# Patient Record
Sex: Male | Born: 1945 | Race: White | Hispanic: No | Marital: Married | Smoking: Former smoker
Health system: Southern US, Community
[De-identification: ages and names within clinical notes are randomized; demographics above are authoritative.]

## PROBLEM LIST (undated history)

## (undated) DIAGNOSIS — I82409 Acute embolism and thrombosis of unspecified deep veins of unspecified lower extremity: Secondary | ICD-10-CM

## (undated) DIAGNOSIS — C61 Malignant neoplasm of prostate: Secondary | ICD-10-CM

## (undated) DIAGNOSIS — I10 Essential (primary) hypertension: Secondary | ICD-10-CM

## (undated) DIAGNOSIS — K5792 Diverticulitis of intestine, part unspecified, without perforation or abscess without bleeding: Secondary | ICD-10-CM

## (undated) DIAGNOSIS — E611 Iron deficiency: Secondary | ICD-10-CM

## (undated) DIAGNOSIS — F32A Depression, unspecified: Secondary | ICD-10-CM

## (undated) DIAGNOSIS — E7800 Pure hypercholesterolemia, unspecified: Secondary | ICD-10-CM

## (undated) DIAGNOSIS — G4730 Sleep apnea, unspecified: Secondary | ICD-10-CM

## (undated) DIAGNOSIS — M109 Gout, unspecified: Secondary | ICD-10-CM

## (undated) HISTORY — PX: PROSTATE BIOPSY: SHX241

## (undated) HISTORY — DX: Iron deficiency: E61.1

## (undated) HISTORY — PX: BACK SURGERY: SHX140

## (undated) HISTORY — DX: Depression, unspecified: F32.A

---
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
## 2003-11-14 HISTORY — PX: POSTERIOR CERVICAL FUSION/FORAMINOTOMY: SHX5038

## 2009-11-13 DIAGNOSIS — I82409 Acute embolism and thrombosis of unspecified deep veins of unspecified lower extremity: Secondary | ICD-10-CM

## 2009-11-13 HISTORY — DX: Acute embolism and thrombosis of unspecified deep veins of unspecified lower extremity: I82.409

## 2013-07-07 DIAGNOSIS — C61 Malignant neoplasm of prostate: Secondary | ICD-10-CM | POA: Insufficient documentation

## 2014-02-23 ENCOUNTER — Encounter (HOSPITAL_BASED_OUTPATIENT_CLINIC_OR_DEPARTMENT_OTHER): Payer: Self-pay | Admitting: Emergency Medicine

## 2014-02-23 DIAGNOSIS — E785 Hyperlipidemia, unspecified: Secondary | ICD-10-CM | POA: Diagnosis present

## 2014-02-23 DIAGNOSIS — Z8546 Personal history of malignant neoplasm of prostate: Secondary | ICD-10-CM

## 2014-02-23 DIAGNOSIS — E860 Dehydration: Secondary | ICD-10-CM | POA: Diagnosis present

## 2014-02-23 DIAGNOSIS — K5000 Crohn's disease of small intestine without complications: Principal | ICD-10-CM | POA: Diagnosis present

## 2014-02-23 DIAGNOSIS — Z79899 Other long term (current) drug therapy: Secondary | ICD-10-CM

## 2014-02-23 DIAGNOSIS — Z7982 Long term (current) use of aspirin: Secondary | ICD-10-CM

## 2014-02-23 DIAGNOSIS — I10 Essential (primary) hypertension: Secondary | ICD-10-CM | POA: Diagnosis present

## 2014-02-23 LAB — URINALYSIS, ROUTINE W REFLEX MICROSCOPIC
BILIRUBIN URINE: NEGATIVE
Glucose, UA: NEGATIVE mg/dL
Ketones, ur: 15 mg/dL — AB
Leukocytes, UA: NEGATIVE
Nitrite: NEGATIVE
PROTEIN: NEGATIVE mg/dL
SPECIFIC GRAVITY, URINE: 1.025 (ref 1.005–1.030)
UROBILINOGEN UA: 0.2 mg/dL (ref 0.0–1.0)
pH: 5 (ref 5.0–8.0)

## 2014-02-23 LAB — URINE MICROSCOPIC-ADD ON

## 2014-02-23 NOTE — ED Notes (Signed)
Abdominal pain and bloating. No relief with bowel movement. Hx of diverticulitis.

## 2014-02-24 ENCOUNTER — Encounter (HOSPITAL_COMMUNITY): Payer: Self-pay | Admitting: General Practice

## 2014-02-24 ENCOUNTER — Emergency Department (HOSPITAL_BASED_OUTPATIENT_CLINIC_OR_DEPARTMENT_OTHER): Payer: 59

## 2014-02-24 ENCOUNTER — Inpatient Hospital Stay (HOSPITAL_BASED_OUTPATIENT_CLINIC_OR_DEPARTMENT_OTHER)
Admission: EM | Admit: 2014-02-24 | Discharge: 2014-02-27 | DRG: 387 | Disposition: A | Discharge status: Home / Self Care | Payer: 59 | Attending: Internal Medicine | Admitting: Internal Medicine

## 2014-02-24 DIAGNOSIS — E7849 Other hyperlipidemia: Secondary | ICD-10-CM | POA: Diagnosis present

## 2014-02-24 DIAGNOSIS — I10 Essential (primary) hypertension: Secondary | ICD-10-CM

## 2014-02-24 DIAGNOSIS — E860 Dehydration: Secondary | ICD-10-CM

## 2014-02-24 DIAGNOSIS — E785 Hyperlipidemia, unspecified: Secondary | ICD-10-CM

## 2014-02-24 DIAGNOSIS — K5000 Crohn's disease of small intestine without complications: Principal | ICD-10-CM

## 2014-02-24 DIAGNOSIS — K529 Noninfective gastroenteritis and colitis, unspecified: Secondary | ICD-10-CM | POA: Diagnosis present

## 2014-02-24 HISTORY — DX: Diverticulitis of intestine, part unspecified, without perforation or abscess without bleeding: K57.92

## 2014-02-24 HISTORY — DX: Malignant neoplasm of prostate: C61

## 2014-02-24 HISTORY — DX: Essential (primary) hypertension: I10

## 2014-02-24 HISTORY — DX: Sleep apnea, unspecified: G47.30

## 2014-02-24 HISTORY — DX: Acute embolism and thrombosis of unspecified deep veins of unspecified lower extremity: I82.409

## 2014-02-24 HISTORY — DX: Gout, unspecified: M10.9

## 2014-02-24 HISTORY — DX: Pure hypercholesterolemia, unspecified: E78.00

## 2014-02-24 LAB — CBC
HCT: 39.6 % (ref 39.0–52.0)
HEMATOCRIT: 42.8 % (ref 39.0–52.0)
Hemoglobin: 14.9 g/dL (ref 13.0–17.0)
Hemoglobin: 13.8 g/dL (ref 13.0–17.0)
MCH: 33 pg (ref 26.0–34.0)
MCH: 33 pg (ref 26.0–34.0)
MCHC: 34.8 g/dL (ref 30.0–36.0)
MCHC: 34.8 g/dL (ref 30.0–36.0)
MCV: 94.7 fL (ref 78.0–100.0)
MCV: 94.7 fL (ref 78.0–100.0)
PLATELETS: 255 10*3/uL (ref 150–400)
Platelets: 266 10*3/uL (ref 150–400)
RBC: 4.52 MIL/uL (ref 4.22–5.81)
RBC: 4.18 MIL/uL — ABNORMAL LOW (ref 4.22–5.81)
RDW: 12.7 % (ref 11.5–15.5)
RDW: 13.1 % (ref 11.5–15.5)
WBC: 18.2 10*3/uL — AB (ref 4.0–10.5)
WBC: 12.4 10*3/uL — AB (ref 4.0–10.5)

## 2014-02-24 LAB — BASIC METABOLIC PANEL
BUN: 15 mg/dL (ref 6–23)
CHLORIDE: 102 meq/L (ref 96–112)
CO2: 23 mEq/L (ref 19–32)
Calcium: 9.5 mg/dL (ref 8.4–10.5)
Creatinine, Ser: 0.9 mg/dL (ref 0.50–1.35)
GFR, EST NON AFRICAN AMERICAN: 86 mL/min — AB (ref >90)
Glucose, Bld: 107 mg/dL — ABNORMAL HIGH (ref 70–99)
Potassium: 4.1 mEq/L (ref 3.7–5.3)
SODIUM: 140 meq/L (ref 137–147)

## 2014-02-24 LAB — DIFFERENTIAL
BASOS ABS: 0 10*3/uL (ref 0.0–0.1)
Basophils Relative: 0 % (ref 0–1)
Eosinophils Absolute: 0.1 10*3/uL (ref 0.0–0.7)
Eosinophils Relative: 1 % (ref 0–5)
LYMPHS ABS: 1.1 10*3/uL (ref 0.7–4.0)
LYMPHS PCT: 6 % — AB (ref 12–46)
Monocytes Absolute: 1.5 10*3/uL — ABNORMAL HIGH (ref 0.1–1.0)
Monocytes Relative: 9 % (ref 3–12)
NEUTROS ABS: 15.1 10*3/uL — AB (ref 1.7–7.7)
NEUTROS PCT: 85 % — AB (ref 43–77)

## 2014-02-24 LAB — CREATININE, SERUM
Creatinine, Ser: 0.9 mg/dL (ref 0.50–1.35)
GFR, EST NON AFRICAN AMERICAN: 86 mL/min — AB (ref >90)

## 2014-02-24 MED ORDER — ONDANSETRON HCL 4 MG PO TABS: 4.0000 mg | ORAL_TABLET | Freq: Four times a day (QID) | ORAL | Status: DC | PRN

## 2014-02-24 MED ORDER — SENNOSIDES-DOCUSATE SODIUM 8.6-50 MG PO TABS: 1.0000 | ORAL_TABLET | Freq: Every evening | ORAL | Status: DC | PRN

## 2014-02-24 MED ORDER — IOHEXOL 300 MG/ML  SOLN
50.0000 mL | Freq: Once | INTRAMUSCULAR | Status: AC | PRN
  Administered 2014-02-24: 50 mL via ORAL

## 2014-02-24 MED ORDER — MORPHINE SULFATE 2 MG/ML IJ SOLN: 2.0000 mg | INTRAMUSCULAR | Status: DC | PRN

## 2014-02-24 MED ORDER — CIPROFLOXACIN IN D5W 400 MG/200ML IV SOLN
400.0000 mg | Freq: Once | INTRAVENOUS | Status: AC
Start: 2014-02-24 — End: 2014-02-24
  Administered 2014-02-24: 400 mg via INTRAVENOUS
  Filled 2014-02-24 (×2): qty 200

## 2014-02-24 MED ORDER — ALUM & MAG HYDROXIDE-SIMETH 200-200-20 MG/5ML PO SUSP: 30.0000 mL | Freq: Four times a day (QID) | ORAL | Status: DC | PRN

## 2014-02-24 MED ORDER — HYDROCODONE-ACETAMINOPHEN 5-325 MG PO TABS: 1.0000 | ORAL_TABLET | ORAL | Status: DC | PRN

## 2014-02-24 MED ORDER — ONDANSETRON HCL 4 MG/2ML IJ SOLN: 4.0000 mg | Freq: Four times a day (QID) | INTRAMUSCULAR | Status: DC | PRN

## 2014-02-24 MED ORDER — ATORVASTATIN CALCIUM 20 MG PO TABS
20.0000 mg | ORAL_TABLET | Freq: Every day | ORAL | Status: DC
  Administered 2014-02-24 – 2014-02-26 (×3): 20 mg via ORAL
  Filled 2014-02-24 (×4): qty 1

## 2014-02-24 MED ORDER — ACETAMINOPHEN 650 MG RE SUPP: 650.0000 mg | Freq: Four times a day (QID) | RECTAL | Status: DC | PRN

## 2014-02-24 MED ORDER — IOHEXOL 300 MG/ML  SOLN
100.0000 mL | Freq: Once | INTRAMUSCULAR | Status: AC | PRN
  Administered 2014-02-24: 100 mL via INTRAVENOUS

## 2014-02-24 MED ORDER — ACETAMINOPHEN 325 MG PO TABS
650.0000 mg | ORAL_TABLET | Freq: Four times a day (QID) | ORAL | Status: DC | PRN
  Administered 2014-02-24 (×2): 650 mg via ORAL
  Filled 2014-02-24 (×3): qty 2

## 2014-02-24 MED ORDER — SODIUM CHLORIDE 0.9 % IV SOLN
INTRAVENOUS | Status: DC
  Administered 2014-02-24 – 2014-02-25 (×3): via INTRAVENOUS

## 2014-02-24 MED ORDER — ENOXAPARIN SODIUM 40 MG/0.4ML ~~LOC~~ SOLN
40.0000 mg | SUBCUTANEOUS | Status: DC
  Administered 2014-02-24 – 2014-02-27 (×4): 40 mg via SUBCUTANEOUS
  Filled 2014-02-24 (×4): qty 0.4

## 2014-02-24 MED ORDER — METRONIDAZOLE IN NACL 5-0.79 MG/ML-% IV SOLN
500.0000 mg | Freq: Once | INTRAVENOUS | Status: AC
  Administered 2014-02-24: 500 mg via INTRAVENOUS
  Filled 2014-02-24: qty 100

## 2014-02-24 MED ORDER — ACETAMINOPHEN 500 MG PO TABS
1000.0000 mg | ORAL_TABLET | Freq: Once | ORAL | Status: AC
  Administered 2014-02-24: 1000 mg via ORAL
  Filled 2014-02-24: qty 2

## 2014-02-24 MED ORDER — SODIUM CHLORIDE 0.9 % IV SOLN
INTRAVENOUS | Status: DC
  Administered 2014-02-24 – 2014-02-26 (×4): via INTRAVENOUS

## 2014-02-24 NOTE — ED Notes (Signed)
Present to ED for RLQ abdominal pain since 1500 today. Pain is constant, non-radiating, not passing gas.  No N/V/D. Normoactive bowel sounds, tender to palpate on RLQ.

## 2014-02-24 NOTE — ED Provider Notes (Signed)
CSN: [id number]     Arrival date & time 02/23/14  2244 History   First MD Initiated Contact with Patient 02/24/14 0206     Chief Complaint  Patient presents with  . Abdominal Pain     (Consider location/radiation/quality/duration/timing/severity/associated sxs/prior Treatment) HPI This is a 68 year old male with a history of diverticulitis. He is here with a sensation of abdominal bloating that began yesterday afternoon. This has been accompanied by loss of appetite. He subsequently developed right lower quadrant pain. This pain has been waxing and waning. It was a 7/10 at its worst but on 310 at the present time. It is worse with palpation or movement. He has had no associated nausea, vomiting, diarrhea, flatulence, fever or chills.  Past Medical History  Diagnosis Date  . Diverticulitis   . Hypertension   . High cholesterol   . Prostate cancer     newly diagnosed 2 weeks ago   Past Surgical History  Procedure Laterality Date  . Back surgery     No family history on file. History  Substance Use Topics  . Smoking status: Never Smoker   . Smokeless tobacco: Not on file  . Alcohol Use: Yes     Comment: at least 2 glasses of wine every day    Review of Systems  All other systems reviewed and are negative.     Allergies  Review of patient's allergies indicates no known allergies.  Home Medications   Current Outpatient Rx  Name  Route  Sig  Dispense  Refill  . atorvastatin (LIPITOR) 20 MG tablet   Oral   Take 20 mg by mouth daily.         . Olmesartan Medoxomil (BENICAR PO)   Oral   Take by mouth.          BP 130/65  Pulse 78  Temp(Src) 98.7 F (37.1 C) (Oral)  Resp 16  SpO2 95%  Physical Exam General: Well-developed, well-nourished male in no acute distress; appearance consistent with age of record HENT: normocephalic; atraumatic Eyes: pupils equal, round and reactive to light; extraocular muscles intact Neck: supple Heart: regular rate and  rhythm Lungs: clear to auscultation bilaterally Abdomen: soft; nondistended; suprapubic and right lower quadrant tenderness; no masses or hepatosplenomegaly; bowel sounds hypoactive Extremities: No deformity; full range of motion; pulses normal Neurologic: Awake, alert and oriented; motor function intact in all extremities and symmetric; no facial droop Skin: Warm and dry Psychiatric: Normal mood and affect    ED Course  Procedures (including critical care time)  MDM   Nursing notes and vitals signs, including pulse oximetry, reviewed.  Summary of this visit's results, reviewed by myself:  Labs:  Results for orders placed during the hospital encounter of 02/24/14 (from the past 24 hour(s))  URINALYSIS, ROUTINE W REFLEX MICROSCOPIC     Status: Abnormal   Collection Time    02/23/14 11:07 PM      Result Value Ref Range   Color, Urine YELLOW  YELLOW   APPearance CLEAR  CLEAR   Specific Gravity, Urine 1.025  1.005 - 1.030   pH 5.0  5.0 - 8.0   Glucose, UA NEGATIVE  NEGATIVE mg/dL   Hgb urine dipstick MODERATE (*) NEGATIVE   Bilirubin Urine NEGATIVE  NEGATIVE   Ketones, ur 15 (*) NEGATIVE mg/dL   Protein, ur NEGATIVE  NEGATIVE mg/dL   Urobilinogen, UA 0.2  0.0 - 1.0 mg/dL   Nitrite NEGATIVE  NEGATIVE   Leukocytes, UA NEGATIVE  NEGATIVE  URINE  MICROSCOPIC-ADD ON     Status: None   Collection Time    02/23/14 11:07 PM      Result Value Ref Range   Squamous Epithelial / LPF RARE  RARE   WBC, UA 0-2  <3 WBC/hpf   RBC / HPF 3-6  <3 RBC/hpf   Bacteria, UA RARE  RARE   Urine-Other MUCOUS PRESENT    CBC     Status: Abnormal   Collection Time    02/24/14  1:50 AM      Result Value Ref Range   WBC 18.2 (*) 4.0 - 10.5 K/uL   RBC 4.52  4.22 - 5.81 MIL/uL   Hemoglobin 14.9  13.0 - 17.0 g/dL   HCT 42.8  39.0 - 52.0 %   MCV 94.7  78.0 - 100.0 fL   MCH 33.0  26.0 - 34.0 pg   MCHC 34.8  30.0 - 36.0 g/dL   RDW 12.7  11.5 - 15.5 %   Platelets 266  150 - 400 K/uL  BASIC METABOLIC  PANEL     Status: Abnormal   Collection Time    02/24/14  1:50 AM      Result Value Ref Range   Sodium 140  137 - 147 mEq/L   Potassium 4.1  3.7 - 5.3 mEq/L   Chloride 102  96 - 112 mEq/L   CO2 23  19 - 32 mEq/L   Glucose, Bld 107 (*) 70 - 99 mg/dL   BUN 15  6 - 23 mg/dL   Creatinine, Ser 0.90  0.50 - 1.35 mg/dL   Calcium 9.5  8.4 - 10.5 mg/dL   GFR calc non Af Amer 86 (*) >90 mL/min   GFR calc Af Amer >90  >90 mL/min  DIFFERENTIAL     Status: Abnormal   Collection Time    02/24/14  1:50 AM      Result Value Ref Range   Neutrophils Relative % 85 (*) 43 - 77 %   Neutro Abs 15.1 (*) 1.7 - 7.7 K/uL   Lymphocytes Relative 6 (*) 12 - 46 %   Lymphs Abs 1.1  0.7 - 4.0 K/uL   Monocytes Relative 9  3 - 12 %   Monocytes Absolute 1.5 (*) 0.1 - 1.0 K/uL   Eosinophils Relative 1  0 - 5 %   Eosinophils Absolute 0.1  0.0 - 0.7 K/uL   Basophils Relative 0  0 - 1 %   Basophils Absolute 0.0  0.0 - 0.1 K/uL    Imaging Studies: Ct Abdomen Pelvis W Contrast  02/24/2014   CLINICAL DATA:  Right lower quadrant pain for 12 hours  EXAM: CT ABDOMEN AND PELVIS WITH CONTRAST  TECHNIQUE: Multidetector CT imaging of the abdomen and pelvis was performed using the standard protocol following bolus administration of intravenous contrast.  CONTRAST:  87mL OMNIPAQUE IOHEXOL 300 MG/ML SOLN, 142mL OMNIPAQUE IOHEXOL 300 MG/ML SOLN  COMPARISON:  None.  FINDINGS: There is a calcified left lower lobe pulmonary nodule likely representing sequela of prior granulomatous disease.  The liver demonstrates no focal abnormality. There is no intrahepatic or extrahepatic biliary ductal dilatation. The gallbladder is normal. The spleen demonstrates no focal abnormality. The kidneys, adrenal glands and pancreas are normal. The bladder is unremarkable.  The stomach, duodenum, small intestine, and large intestine demonstrate no bowel dilatation to suggest obstruction. There is terminal ileal bowel wall thickening and surrounding  inflammatory changes. There is cecal wall thickening. There are multiple prominent right lower quadrant  lymph nodes present. There is a prominent caliber appendix without periappendiceal inflammatory changes. There is a fat containing umbilical hernia. There is diverticulosis without evidence of diverticulitis. There is no pneumoperitoneum, pneumatosis, or portal venous gas. There is no abdominal or pelvic free fluid. There is no lymphadenopathy.  The abdominal aorta is normal in caliber with atherosclerosis.  There are no lytic or sclerotic osseous lesions.  IMPRESSION: Bowel wall thickening and inflammatory changes involving the terminal ileum and cecum with a few mildly prominent right lower quadrant lymph nodes. This appearance can be seen with an infectious or inflammatory etiology. There is no drainable fluid collection.   Electronically Signed   By: Kathreen Devoid   On: 02/24/2014 03:36   4:47 AM Dr. Shanon Brow accepts for admission to [facility] Will start Cipro and Flagyl for possible infectious etiology.    Wynetta Fines, MD 02/24/14 [phone]

## 2014-02-24 NOTE — ED Notes (Signed)
Patient transported to CT 

## 2014-02-24 NOTE — ED Notes (Signed)
MD at bedside. 

## 2014-02-24 NOTE — H&P (Signed)
[facility] History and Physical  Ragnar Waas LDJ:[id number] DOB: [birth year] DOA: 02/24/2014  Referring physician: Wynetta Fines, MD PCP: Karlene Einstein, MD    Chief Complaint: abdominal pain  HPI: Terry Myers is a 68 y.o. male with PMH as below who presented with abdominal pain which started out in the mid abdomen at 3 PM on 4/13, and then moved to the right lower quadrant. Associated with bloating and lack of appetite. No fevers, vomiting or diarrhea. H/o diverticulitis in the past once. He has had some constipation and has used a stool softener BID for the past week.    General: The patient denies anorexia, fever, weight loss Cardiac: Denies chest pain, syncope, + palpitations - one episode the day after neighbor died, pedal edema  Respiratory: Denies dyspnea on exertion, cough, shortness of breath, wheezing  GI: Denies severe  indigestion/heartburn, abdominal pain, nausea, vomiting, diarrhea  GU: Denies hematuria, incontinence, dysuria - had a foley cah 2 wks ago - became clotted with blood and remove Musculoskeletal: Denies muscle pain Skin: Denies suspicious skin lesions Neurologic: Denies focal weakness or numbness, change in vision  Past Medical History  Diagnosis Date  . Diverticulitis   . Hypertension   . High cholesterol   . Prostate cancer- had and appt at [facility] today     2012   Past Surgical History  Procedure Laterality Date  . Back surgery     Social History:  reports that he has never smoked. He does not have any smokeless tobacco history on file. He reports that he drinks alcohol. He reports that he does not use illicit drugs. Lives at home- works at [employer] with [employer]  No Known Allergies  No family history on file.  Mother had breast CA  Prior to Admission medications   Medication Sig Start Date End Date Taking? Authorizing Provider  atorvastatin (LIPITOR) 20 MG tablet Take 20 mg by mouth daily.   Yes Historical Provider,  MD  Olmesartan Medoxomil (BENICAR PO) 12.5 mg daily Take by mouth.   Yes Historical Provider, MD     Physical Exam: Filed Vitals:   02/24/14 0625  BP: 127/58  Pulse: 69  Temp: 98.5 F (36.9 C)  Resp: 20    General:AAO x 3, no acute distress HEENT: Normocephalic and Atraumatic, Mucous membranes pink- oral mucosa dry                PERRLA; EOM intact; No scleral icterus,                 Nares: Patent, Oropharynx: Clear, Fair Dentition                 Neck: FROM, no cervical lymphadenopathy, thyromegaly, carotid bruit or JVD;  Breasts: deferred CHEST WALL: No tenderness  CHEST: Normal respiration, clear to auscultation bilaterally  HEART: Regular rate and rhythm; no murmurs rubs or gallops  BACK: No kyphosis or scoliosis; no CVA tenderness  ABDOMEN: Positive Bowel Sounds, soft, Quite tender in RLQ and suprapubic areas,  no masses, no organomegaly Rectal Exam: deferred EXTREMITIES: No cyanosis, clubbing, or edema Genitalia: not examined  SKIN:  no rash or ulceration  CNS: Alert and Oriented x 4, Nonfocal exam, CN 2-12 intact  Labs on Admission:  Basic Metabolic Panel:  Recent Labs Lab 02/24/14 0150  NA 140  K 4.1  CL 102  CO2 23  GLUCOSE 107*  BUN 15  CREATININE 0.90  CALCIUM 9.5   Liver Function Tests: No results  found for this basename: AST, ALT, ALKPHOS, BILITOT, PROT, ALBUMIN,  in the last 168 hours No results found for this basename: LIPASE, AMYLASE,  in the last 168 hours No results found for this basename: AMMONIA,  in the last 168 hours CBC:  Recent Labs Lab 02/24/14 0150  WBC 18.2*  NEUTROABS 15.1*  HGB 14.9  HCT 42.8  MCV 94.7  PLT 266   Cardiac Enzymes: No results found for this basename: CKTOTAL, CKMB, CKMBINDEX, TROPONINI,  in the last 168 hours  BNP (last 3 results) No results found for this basename: PROBNP,  in the last 8760 hours CBG: No results found for this basename: GLUCAP,  in the last 168 hours  Radiological Exams on  Admission: Ct Abdomen Pelvis W Contrast  02/24/2014   CLINICAL DATA:  Right lower quadrant pain for 12 hours  EXAM: CT ABDOMEN AND PELVIS WITH CONTRAST  TECHNIQUE: Multidetector CT imaging of the abdomen and pelvis was performed using the standard protocol following bolus administration of intravenous contrast.  CONTRAST:  58mL OMNIPAQUE IOHEXOL 300 MG/ML SOLN, 171mL OMNIPAQUE IOHEXOL 300 MG/ML SOLN  COMPARISON:  None.  FINDINGS: There is a calcified left lower lobe pulmonary nodule likely representing sequela of prior granulomatous disease.  The liver demonstrates no focal abnormality. There is no intrahepatic or extrahepatic biliary ductal dilatation. The gallbladder is normal. The spleen demonstrates no focal abnormality. The kidneys, adrenal glands and pancreas are normal. The bladder is unremarkable.  The stomach, duodenum, small intestine, and large intestine demonstrate no bowel dilatation to suggest obstruction. There is terminal ileal bowel wall thickening and surrounding inflammatory changes. There is cecal wall thickening. There are multiple prominent right lower quadrant lymph nodes present. There is a prominent caliber appendix without periappendiceal inflammatory changes. There is a fat containing umbilical hernia. There is diverticulosis without evidence of diverticulitis. There is no pneumoperitoneum, pneumatosis, or portal venous gas. There is no abdominal or pelvic free fluid. There is no lymphadenopathy.  The abdominal aorta is normal in caliber with atherosclerosis.  There are no lytic or sclerotic osseous lesions.  IMPRESSION: Bowel wall thickening and inflammatory changes involving the terminal ileum and cecum with a few mildly prominent right lower quadrant lymph nodes. This appearance can be seen with an infectious or inflammatory etiology. There is no drainable fluid collection.   Electronically Signed   By: Kathreen Devoid   On: 02/24/2014 03:36    EKG: no  EKG  Assessment/Plan Principal Problem:   Terminal ileitis - Cipro/ Flagyl, Clear liquids- has had a colonoscopy in the past  Active Problems:    Dehydration - IVF hydration    HTN (hypertension) - hold Benicar for today as dehydrated and BP not elevated    Other and unspecified hyperlipidemia - cont Zocor   Consulted: none  Code Status: FULL code  Family Communication: with wife  Disposition Plan: home in 1-2 days   Time spent: >45 in  Debbe Odea, MD [facility]  If 7PM-7AM, please contact night-coverage [email or web address] 02/24/2014, 8:14 AM

## 2014-02-24 NOTE — ED Notes (Signed)
Report given to [place] RN and [facility] at bedside

## 2014-02-25 LAB — CBC
HCT: 39.2 % (ref 39.0–52.0)
Hemoglobin: 13.2 g/dL (ref 13.0–17.0)
MCH: 32.2 pg (ref 26.0–34.0)
MCHC: 33.7 g/dL (ref 30.0–36.0)
MCV: 95.6 fL (ref 78.0–100.0)
PLATELETS: 269 10*3/uL (ref 150–400)
RBC: 4.1 MIL/uL — ABNORMAL LOW (ref 4.22–5.81)
RDW: 13.3 % (ref 11.5–15.5)
WBC: 6.9 10*3/uL (ref 4.0–10.5)

## 2014-02-25 LAB — BASIC METABOLIC PANEL
BUN: 7 mg/dL (ref 6–23)
CALCIUM: 8.9 mg/dL (ref 8.4–10.5)
CO2: 23 meq/L (ref 19–32)
CREATININE: 0.87 mg/dL (ref 0.50–1.35)
Chloride: 104 mEq/L (ref 96–112)
GFR calc non Af Amer: 87 mL/min — ABNORMAL LOW (ref >90)
Glucose, Bld: 89 mg/dL (ref 70–99)
Potassium: 3.9 mEq/L (ref 3.7–5.3)
Sodium: 139 mEq/L (ref 137–147)

## 2014-02-25 MED ORDER — IRBESARTAN 75 MG PO TABS
75.0000 mg | ORAL_TABLET | Freq: Every day | ORAL | Status: DC
  Administered 2014-02-25 – 2014-02-27 (×3): 75 mg via ORAL
  Filled 2014-02-25 (×4): qty 1

## 2014-02-25 MED ORDER — ASPIRIN EC 81 MG PO TBEC
81.0000 mg | DELAYED_RELEASE_TABLET | Freq: Every day | ORAL | Status: DC
  Administered 2014-02-25 – 2014-02-27 (×3): 81 mg via ORAL
  Filled 2014-02-25 (×3): qty 1

## 2014-02-25 NOTE — Progress Notes (Signed)
[facility] PROGRESS NOTE  Terry Myers JGG:[id number] DOB: 1946/05/21 DOA: 02/24/2014 PCP: Karlene Einstein, MD  Assessment/Plan:  Principal Problem:   Terminal ileitis/colitis: presume infectious etiology. Now with profuse watery diarrhea. Continue inpatient status. Continue Cipro and Flagyl empirically. Have ordered GI pathogen panel. White blood cell count improved. Pain somewhat improved. No nausea or vomiting. Decrease IV fluids to 75 cc an hour. Advance to full  liquid diet. Reports having had a colonoscopy about 3 years ago in [place] by a [facility] gastroenterologist. Active Problems:   HTN (hypertension): Resume ARB at lower dose. Hold thiazide   Other and unspecified hyperlipidemia: On statin   Dehydration: Currently euvolemic, but having frequent watery stool.   Code Status:  full Family Communication:   Disposition Plan:  home 1-2 days if continues to improve.  Consultants:  None  Procedures:   None  Antibiotics:  Cipro, Flagyl 4/14  HPI/Subjective: Right lower quadrant Pain remains but improved. Having frequent watery stool. No nausea vomiting. No fevers or chills. Drinks well water. No sick contacts. No recent antibiotics. No suspect foods.  Objective: Filed Vitals:   02/25/14 0532  BP: 125/61  Pulse: 51  Temp: 98.4 F (36.9 C)  Resp: 16    Intake/Output Summary (Last 24 hours) at 02/25/14 1015 Last data filed at 02/25/14 0830  Gross per 24 hour  Intake   3885 ml  Output   4005 ml  Net   -120 ml   Filed Weights   02/25/14 0845  Weight: 99.791 kg (220 lb)    Exam:   General:  Nontoxic appearing.  Cardiovascular: Regular rate rhythm without murmurs gallops rubs  Respiratory: Clear to auscultation bilaterally without wheezes rhonchi or rales  Abdomen: Soft, mild right upper quadrant tenderness. Bowel sounds present.  Ext: No clubbing cyanosis or edema.  Basic Metabolic Panel:  Recent Labs Lab 02/24/14 0150  02/24/14 0950 02/25/14 0527  NA 140  --  139  K 4.1  --  3.9  CL 102  --  104  CO2 23  --  23  GLUCOSE 107*  --  89  BUN 15  --  7  CREATININE 0.90 0.90 0.87  CALCIUM 9.5  --  8.9   Liver Function Tests: No results found for this basename: AST, ALT, ALKPHOS, BILITOT, PROT, ALBUMIN,  in the last 168 hours No results found for this basename: LIPASE, AMYLASE,  in the last 168 hours No results found for this basename: AMMONIA,  in the last 168 hours CBC:  Recent Labs Lab 02/24/14 0150 02/24/14 0950 02/25/14 0527  WBC 18.2* 12.4* 6.9  NEUTROABS 15.1*  --   --   HGB 14.9 13.8 13.2  HCT 42.8 39.6 39.2  MCV 94.7 94.7 95.6  PLT 266 255 269   Cardiac Enzymes: No results found for this basename: CKTOTAL, CKMB, CKMBINDEX, TROPONINI,  in the last 168 hours BNP (last 3 results) No results found for this basename: PROBNP,  in the last 8760 hours CBG: No results found for this basename: GLUCAP,  in the last 168 hours  No results found for this or any previous visit (from the past 240 hour(s)).   Studies: Ct Abdomen Pelvis W Contrast  02/24/2014   CLINICAL DATA:  Right lower quadrant pain for 12 hours  EXAM: CT ABDOMEN AND PELVIS WITH CONTRAST  TECHNIQUE: Multidetector CT imaging of the abdomen and pelvis was performed using the standard protocol following bolus administration of intravenous contrast.  CONTRAST:  68mL OMNIPAQUE IOHEXOL 300  MG/ML SOLN, 157mL OMNIPAQUE IOHEXOL 300 MG/ML SOLN  COMPARISON:  None.  FINDINGS: There is a calcified left lower lobe pulmonary nodule likely representing sequela of prior granulomatous disease.  The liver demonstrates no focal abnormality. There is no intrahepatic or extrahepatic biliary ductal dilatation. The gallbladder is normal. The spleen demonstrates no focal abnormality. The kidneys, adrenal glands and pancreas are normal. The bladder is unremarkable.  The stomach, duodenum, small intestine, and large intestine demonstrate no bowel dilatation to  suggest obstruction. There is terminal ileal bowel wall thickening and surrounding inflammatory changes. There is cecal wall thickening. There are multiple prominent right lower quadrant lymph nodes present. There is a prominent caliber appendix without periappendiceal inflammatory changes. There is a fat containing umbilical hernia. There is diverticulosis without evidence of diverticulitis. There is no pneumoperitoneum, pneumatosis, or portal venous gas. There is no abdominal or pelvic free fluid. There is no lymphadenopathy.  The abdominal aorta is normal in caliber with atherosclerosis.  There are no lytic or sclerotic osseous lesions.  IMPRESSION: Bowel wall thickening and inflammatory changes involving the terminal ileum and cecum with a few mildly prominent right lower quadrant lymph nodes. This appearance can be seen with an infectious or inflammatory etiology. There is no drainable fluid collection.   Electronically Signed   By: Kathreen Devoid   On: 02/24/2014 03:36    Scheduled Meds: . atorvastatin  20 mg Oral Daily  . enoxaparin (LOVENOX) injection  40 mg Subcutaneous Q24H   Continuous Infusions: . sodium chloride 125 mL/hr at 02/24/14 0241  . sodium chloride 125 mL/hr at 02/25/14 0534    Time spent: 35 minutes  [facility] Pager [phone]. If 7PM-7AM, please contact night-coverage at [email or web address], password [facility] 02/25/2014, 10:15 AM  LOS: 1 day

## 2014-02-26 LAB — GI PATHOGEN PANEL BY PCR, STOOL
C difficile toxin A/B: NEGATIVE
Campylobacter by PCR: NEGATIVE
Cryptosporidium by PCR: NEGATIVE
E coli (ETEC) LT/ST: NEGATIVE
E coli (STEC): NEGATIVE
E coli 0157 by PCR: NEGATIVE
G LAMBLIA BY PCR: NEGATIVE
Norovirus GI/GII: NEGATIVE
ROTAVIRUS A BY PCR: NEGATIVE
SHIGELLA BY PCR: NEGATIVE
Salmonella by PCR: NEGATIVE

## 2014-02-26 MED ORDER — METRONIDAZOLE IN NACL 5-0.79 MG/ML-% IV SOLN
500.0000 mg | Freq: Three times a day (TID) | INTRAVENOUS | Status: DC
  Administered 2014-02-26 – 2014-02-27 (×3): 500 mg via INTRAVENOUS
  Filled 2014-02-26 (×4): qty 100

## 2014-02-26 MED ORDER — CIPROFLOXACIN IN D5W 400 MG/200ML IV SOLN
400.0000 mg | Freq: Two times a day (BID) | INTRAVENOUS | Status: DC
  Administered 2014-02-26 – 2014-02-27 (×2): 400 mg via INTRAVENOUS
  Filled 2014-02-26 (×3): qty 200

## 2014-02-26 NOTE — Progress Notes (Signed)
Patient ID: Terry Myers  male  JOA:[id number]    DOB: 05-Feb-1946    DOA: 02/24/2014  PCP: Karlene Einstein, MD  Assessment/Plan: Principal Problem:   Terminal ileitis - Still having watery diarrhea 3 bowel movements today, GI pathogen panel still pending - Received Cipro and Flagyl in ER, Continue ciprofloxacin and Flagyl - Pain has improved, no nausea vomiting, advance diet to solids today  Active Problems:   HTN (hypertension) - Currently stable    Other and unspecified hyperlipidemia - Statins held for now    Dehydration - Continue IV fluid hydration   DVT Prophylaxis:  Code Status: Full code  Family Communication: Discussed with patient's wife in detail  Disposition: Hopefully home tomorrow  Consultants:  None  Procedures:  None  Antibiotics:  IV Cipro and Flagyl    Subjective: Patient reports that history of having diarrhea 3 watery loose BMs since morning, no blood  Objective: Weight change:   Intake/Output Summary (Last 24 hours) at 02/26/14 1543 Last data filed at 02/26/14 0600  Gross per 24 hour  Intake 5467.08 ml  Output   2600 ml  Net 2867.08 ml   Blood pressure 122/58, pulse 48, temperature 97.6 F (36.4 C), temperature source Oral, resp. rate 16, height 6' (1.829 m), weight 99.791 kg (220 lb), SpO2 97.00%.  Physical Exam: General: Alert and awake, oriented x3, not in any acute distress. CVS: S1-S2 clear, no murmur rubs or gallops Chest: clear to auscultation bilaterally, no wheezing, rales or rhonchi Abdomen: soft nontender, mildly distended, normal bowel sounds  Extremities: no cyanosis, clubbing or edema noted bilaterally Neuro: Cranial nerves II-XII intact, no focal neurological deficits  Lab Results: Basic Metabolic Panel:  Recent Labs Lab 02/24/14 0150 02/24/14 0950 02/25/14 0527  NA 140  --  139  K 4.1  --  3.9  CL 102  --  104  CO2 23  --  23  GLUCOSE 107*  --  89  BUN 15  --  7  CREATININE 0.90 0.90 0.87    CALCIUM 9.5  --  8.9   Liver Function Tests: No results found for this basename: AST, ALT, ALKPHOS, BILITOT, PROT, ALBUMIN,  in the last 168 hours No results found for this basename: LIPASE, AMYLASE,  in the last 168 hours No results found for this basename: AMMONIA,  in the last 168 hours CBC:  Recent Labs Lab 02/24/14 0150 02/24/14 0950 02/25/14 0527  WBC 18.2* 12.4* 6.9  NEUTROABS 15.1*  --   --   HGB 14.9 13.8 13.2  HCT 42.8 39.6 39.2  MCV 94.7 94.7 95.6  PLT 266 255 269   Cardiac Enzymes: No results found for this basename: CKTOTAL, CKMB, CKMBINDEX, TROPONINI,  in the last 168 hours BNP: No components found with this basename: POCBNP,  CBG: No results found for this basename: GLUCAP,  in the last 168 hours   Micro Results: No results found for this or any previous visit (from the past 240 hour(s)).  Studies/Results: Ct Abdomen Pelvis W Contrast  02/24/2014   CLINICAL DATA:  Right lower quadrant pain for 12 hours  EXAM: CT ABDOMEN AND PELVIS WITH CONTRAST  TECHNIQUE: Multidetector CT imaging of the abdomen and pelvis was performed using the standard protocol following bolus administration of intravenous contrast.  CONTRAST:  37mL OMNIPAQUE IOHEXOL 300 MG/ML SOLN, 174mL OMNIPAQUE IOHEXOL 300 MG/ML SOLN  COMPARISON:  None.  FINDINGS: There is a calcified left lower lobe pulmonary nodule likely representing sequela of prior granulomatous disease.  The liver demonstrates no focal abnormality. There is no intrahepatic or extrahepatic biliary ductal dilatation. The gallbladder is normal. The spleen demonstrates no focal abnormality. The kidneys, adrenal glands and pancreas are normal. The bladder is unremarkable.  The stomach, duodenum, small intestine, and large intestine demonstrate no bowel dilatation to suggest obstruction. There is terminal ileal bowel wall thickening and surrounding inflammatory changes. There is cecal wall thickening. There are multiple prominent right lower  quadrant lymph nodes present. There is a prominent caliber appendix without periappendiceal inflammatory changes. There is a fat containing umbilical hernia. There is diverticulosis without evidence of diverticulitis. There is no pneumoperitoneum, pneumatosis, or portal venous gas. There is no abdominal or pelvic free fluid. There is no lymphadenopathy.  The abdominal aorta is normal in caliber with atherosclerosis.  There are no lytic or sclerotic osseous lesions.  IMPRESSION: Bowel wall thickening and inflammatory changes involving the terminal ileum and cecum with a few mildly prominent right lower quadrant lymph nodes. This appearance can be seen with an infectious or inflammatory etiology. There is no drainable fluid collection.   Electronically Signed   By: Kathreen Devoid   On: 02/24/2014 03:36    Medications: Scheduled Meds: . aspirin EC  81 mg Oral Daily  . atorvastatin  20 mg Oral Daily  . enoxaparin (LOVENOX) injection  40 mg Subcutaneous Q24H  . irbesartan  75 mg Oral Daily      LOS: 2 days   Ripudeep Krystal Eaton M.D. [facility] 02/26/2014, 3:43 PM Pager: [phone]  If 7PM-7AM, please contact night-coverage [email or web address] Password [email or web address]  **Disclaimer: This note was dictated with voice recognition software. Similar sounding words can inadvertently be transcribed and this note may contain transcription errors which may not have been corrected upon publication of note.**

## 2014-02-27 MED ORDER — LOPERAMIDE HCL 2 MG PO CAPS: 2.0000 mg | ORAL_CAPSULE | ORAL | Status: DC | PRN

## 2014-02-27 MED ORDER — HYDROCODONE-ACETAMINOPHEN 5-325 MG PO TABS: 1.0000 | ORAL_TABLET | Freq: Four times a day (QID) | ORAL | Status: DC | PRN

## 2014-02-27 MED ORDER — METRONIDAZOLE 500 MG PO TABS: 500.0000 mg | ORAL_TABLET | Freq: Three times a day (TID) | ORAL | Status: DC

## 2014-02-27 MED ORDER — CIPROFLOXACIN HCL 500 MG PO TABS: 500.0000 mg | ORAL_TABLET | Freq: Two times a day (BID) | ORAL | Status: DC

## 2014-02-27 MED ORDER — SACCHAROMYCES BOULARDII 250 MG PO CAPS: 250.0000 mg | ORAL_CAPSULE | Freq: Two times a day (BID) | ORAL | Status: DC

## 2014-02-27 MED ORDER — PROMETHAZINE HCL 12.5 MG PO TABS: 12.5000 mg | ORAL_TABLET | Freq: Four times a day (QID) | ORAL | Status: DC | PRN

## 2014-02-27 NOTE — Progress Notes (Signed)
Patient discharged to home with instructions. 

## 2014-02-27 NOTE — Discharge Summary (Signed)
Physician Discharge Summary  Patient ID: Terry Myers MRN: [id number] DOB/AGE: [birth year] 68 y.o.  Admit date: 02/24/2014 Discharge date: 02/27/2014  Primary Care Physician:  Karlene Einstein, MD  Discharge Diagnoses:    . acute Terminal ileitis . abdominal pain right lower quadrant   Profuse diarrhea  . HTN (hypertension) . Other and unspecified hyperlipidemia . Dehydration  Consults: None   Recommendations for Outpatient Follow-up:  Patient should have a GI referral if his symptoms return for a colonoscopy  Allergies:  No Known Allergies   Discharge Medications:   Medication List         aspirin EC 81 MG tablet  Take 81 mg by mouth daily.     ciprofloxacin 500 MG tablet  Commonly known as:  CIPRO  Take 1 tablet (500 mg total) by mouth 2 (two) times daily. X 12 days     HYDROcodone-acetaminophen 5-325 MG per tablet  Commonly known as:  NORCO/VICODIN  Take 1 tablet by mouth every 6 (six) hours as needed for moderate pain.     LIPITOR 20 MG tablet  Generic drug:  atorvastatin  Take 20 mg by mouth daily.     loperamide 2 MG capsule  Commonly known as:  IMODIUM  Take 1 capsule (2 mg total) by mouth as needed for diarrhea or loose stools (also available over the counter).     metroNIDAZOLE 500 MG tablet  Commonly known as:  FLAGYL  Take 1 tablet (500 mg total) by mouth 3 (three) times daily. X 12 days     olmesartan-hydrochlorothiazide 20-12.5 MG per tablet  Commonly known as:  BENICAR HCT  Take 1 tablet by mouth daily.     promethazine 12.5 MG tablet  Commonly known as:  PHENERGAN  Take 1 tablet (12.5 mg total) by mouth every 6 (six) hours as needed for nausea or vomiting.     saccharomyces boulardii 250 MG capsule  Commonly known as:  FLORASTOR  Take 1 capsule (250 mg total) by mouth 2 (two) times daily. X 2 weeks         Brief H and P: For complete details please refer to admission H and P, but in brief Terry Myers is a 68 y.o. male presented  with abdominal pain which started out in the mid abdomen at 3 PM on 4/13, and then moved to the right lower quadrant. Associated with bloating and lack of appetite. No fevers, vomiting or diarrhea. H/o diverticulitis in the past once. He has had some constipation and has used a stool softener BID for the past week.    Hospital Course:   Acute abdominal pain, nausea and vomiting with profuse diarrhea due to Terminal ileitis  Patient was admitted for further workup. CT abdomen and pelvis was done which showed bowel wall thickening and inflammatory changes involving the terminal ileum and cecum with a few mildly prominent right lower quadrant lymph nodes, can be seen with infectious or inflammatory etiology. Noted a fluid collection. GI pathogen panel was negative. Patient was placed on IV ciprofloxacin and Flagyl. Patient had solid bowel movement today and has been tolerating solid diet. Abdominal pain, nausea and vomiting has improved. He has remained afebrile and leukocytosis has resolved. Patient was transitioned to oral antibiotics for 12 days to complete a course for 2 weeks.   HTN (hypertension) - Currently stable   Other and unspecified hyperlipidemia - restart statin  Dehydration -patient was provided IV fluid hydration during hospitalization currently tolerating solid. He was encouraged to continue to  stay hydrated.   Day of Discharge BP 117/61  Pulse 45  Temp(Src) 97.8 F (36.6 C) (Oral)  Resp 16  Ht 6' (1.829 m)  Wt 99.791 kg (220 lb)  BMI 29.83 kg/m2  SpO2 95%  Physical Exam: General: Alert and awake oriented x3 not in any acute distress. HEENT: anicteric sclera, pupils reactive to light and accommodation CVS: S1-S2 clear no murmur rubs or gallops Chest: clear to auscultation bilaterally, no wheezing rales or rhonchi Abdomen: soft nontender, nondistended, normal bowel sounds Extremities: no cyanosis, clubbing or edema noted bilaterally Neuro: Cranial nerves II-XII intact,  no focal neurological deficits   The results of significant diagnostics from this hospitalization (including imaging, microbiology, ancillary and laboratory) are listed below for reference.    LAB RESULTS: Basic Metabolic Panel:  Recent Labs Lab 02/24/14 0150 02/24/14 0950 02/25/14 0527  NA 140  --  139  K 4.1  --  3.9  CL 102  --  104  CO2 23  --  23  GLUCOSE 107*  --  89  BUN 15  --  7  CREATININE 0.90 0.90 0.87  CALCIUM 9.5  --  8.9   Liver Function Tests: No results found for this basename: AST, ALT, ALKPHOS, BILITOT, PROT, ALBUMIN,  in the last 168 hours No results found for this basename: LIPASE, AMYLASE,  in the last 168 hours No results found for this basename: AMMONIA,  in the last 168 hours CBC:  Recent Labs Lab 02/24/14 0150 02/24/14 0950 02/25/14 0527  WBC 18.2* 12.4* 6.9  NEUTROABS 15.1*  --   --   HGB 14.9 13.8 13.2  HCT 42.8 39.6 39.2  MCV 94.7 94.7 95.6  PLT 266 255 269   Cardiac Enzymes: No results found for this basename: CKTOTAL, CKMB, CKMBINDEX, TROPONINI,  in the last 168 hours BNP: No components found with this basename: POCBNP,  CBG: No results found for this basename: GLUCAP,  in the last 168 hours  Significant Diagnostic Studies:  Ct Abdomen Pelvis W Contrast  02/24/2014   CLINICAL DATA:  Right lower quadrant pain for 12 hours  EXAM: CT ABDOMEN AND PELVIS WITH CONTRAST  TECHNIQUE: Multidetector CT imaging of the abdomen and pelvis was performed using the standard protocol following bolus administration of intravenous contrast.  CONTRAST:  56mL OMNIPAQUE IOHEXOL 300 MG/ML SOLN, 164mL OMNIPAQUE IOHEXOL 300 MG/ML SOLN  COMPARISON:  None.  FINDINGS: There is a calcified left lower lobe pulmonary nodule likely representing sequela of prior granulomatous disease.  The liver demonstrates no focal abnormality. There is no intrahepatic or extrahepatic biliary ductal dilatation. The gallbladder is normal. The spleen demonstrates no focal abnormality.  The kidneys, adrenal glands and pancreas are normal. The bladder is unremarkable.  The stomach, duodenum, small intestine, and large intestine demonstrate no bowel dilatation to suggest obstruction. There is terminal ileal bowel wall thickening and surrounding inflammatory changes. There is cecal wall thickening. There are multiple prominent right lower quadrant lymph nodes present. There is a prominent caliber appendix without periappendiceal inflammatory changes. There is a fat containing umbilical hernia. There is diverticulosis without evidence of diverticulitis. There is no pneumoperitoneum, pneumatosis, or portal venous gas. There is no abdominal or pelvic free fluid. There is no lymphadenopathy.  The abdominal aorta is normal in caliber with atherosclerosis.  There are no lytic or sclerotic osseous lesions.  IMPRESSION: Bowel wall thickening and inflammatory changes involving the terminal ileum and cecum with a few mildly prominent right lower quadrant lymph nodes. This appearance can  be seen with an infectious or inflammatory etiology. There is no drainable fluid collection.   Electronically Signed   By: Kathreen Devoid   On: 02/24/2014 03:36       Disposition and Follow-up:     Discharge Orders   Future Orders Complete By Expires   Diet - low sodium heart healthy  As directed    Increase activity slowly  As directed        DISPOSITION: Home DIET: Heart healthy  DISCHARGE FOLLOW-UP Follow-up Information   Follow up with Karlene Einstein, MD. Schedule an appointment as soon as possible for a visit in 2 weeks. (for hospital follow-up)    Specialty:  Family Medicine   Contact information:   [address] [place] [address] [phone]       Time spent on Discharge: 35 minutes  Signed:   Mendel Corning M.D. [facility] 02/27/2014, 2:25 PM Pager: [phone]   **Disclaimer: This note was dictated with voice recognition software. Similar sounding words can  inadvertently be transcribed and this note may contain transcription errors which may not have been corrected upon publication of note.**

## 2015-01-27 DIAGNOSIS — G4733 Obstructive sleep apnea (adult) (pediatric): Secondary | ICD-10-CM | POA: Insufficient documentation

## 2015-01-29 DIAGNOSIS — D6859 Other primary thrombophilia: Secondary | ICD-10-CM | POA: Insufficient documentation

## 2015-04-07 DIAGNOSIS — K37 Unspecified appendicitis: Secondary | ICD-10-CM | POA: Insufficient documentation

## 2016-01-09 DIAGNOSIS — I774 Celiac artery compression syndrome: Secondary | ICD-10-CM | POA: Insufficient documentation

## 2016-01-09 DIAGNOSIS — K5282 Eosinophilic colitis: Secondary | ICD-10-CM | POA: Insufficient documentation

## 2016-01-11 DIAGNOSIS — E538 Deficiency of other specified B group vitamins: Secondary | ICD-10-CM | POA: Insufficient documentation

## 2016-01-11 DIAGNOSIS — D638 Anemia in other chronic diseases classified elsewhere: Secondary | ICD-10-CM | POA: Insufficient documentation

## 2016-01-11 DIAGNOSIS — R7301 Impaired fasting glucose: Secondary | ICD-10-CM | POA: Insufficient documentation

## 2017-01-17 DIAGNOSIS — M720 Palmar fascial fibromatosis [Dupuytren]: Secondary | ICD-10-CM | POA: Insufficient documentation

## 2017-01-17 DIAGNOSIS — L309 Dermatitis, unspecified: Secondary | ICD-10-CM | POA: Insufficient documentation

## 2018-01-21 DIAGNOSIS — Z7901 Long term (current) use of anticoagulants: Secondary | ICD-10-CM | POA: Insufficient documentation

## 2019-04-23 DIAGNOSIS — K140 Glossitis: Secondary | ICD-10-CM | POA: Insufficient documentation

## 2020-04-28 DIAGNOSIS — M6530 Trigger finger, unspecified finger: Secondary | ICD-10-CM | POA: Insufficient documentation

## 2020-11-03 DIAGNOSIS — H2513 Age-related nuclear cataract, bilateral: Secondary | ICD-10-CM | POA: Insufficient documentation

## 2020-11-03 DIAGNOSIS — H43813 Vitreous degeneration, bilateral: Secondary | ICD-10-CM | POA: Insufficient documentation

## 2020-11-03 DIAGNOSIS — H11153 Pinguecula, bilateral: Secondary | ICD-10-CM | POA: Insufficient documentation

## 2020-11-03 DIAGNOSIS — H02831 Dermatochalasis of right upper eyelid: Secondary | ICD-10-CM | POA: Insufficient documentation

## 2020-11-03 DIAGNOSIS — H35033 Hypertensive retinopathy, bilateral: Secondary | ICD-10-CM | POA: Insufficient documentation

## 2020-11-03 DIAGNOSIS — H268 Other specified cataract: Secondary | ICD-10-CM | POA: Insufficient documentation

## 2021-03-03 ENCOUNTER — Telehealth: Payer: Self-pay | Admitting: Family Medicine

## 2021-03-03 NOTE — Telephone Encounter (Signed)
See below

## 2021-03-03 NOTE — Telephone Encounter (Signed)
Patient last provider has retired  From [place] of 40 years. He's looking for a new provider and asked that I ask you. He noted that he saw great review online and would  Like to come on with you. Please advise

## 2021-03-03 NOTE — Telephone Encounter (Signed)
I can not take anyone new at this time but I believe Terry Myers is ----

## 2021-03-03 NOTE — Telephone Encounter (Signed)
Hey Dr. Nani Ravens. This patient is looking for a new provider. I know he is our of your age range for NP but he requested I send a message. I did offer him Josph Macho and Percell Miller, But he prefers an MD. Patient last provider has retired  From [place] of 40 years. He's looking for a new provider and asked that I ask you. He noted that he saw great review online and would  Like to come on with you. Please advise

## 2021-03-04 NOTE — Telephone Encounter (Signed)
OK to sched appt to see if we are a good fit for each other. Ty.

## 2021-03-04 NOTE — Telephone Encounter (Signed)
Thank you :)

## 2021-03-24 NOTE — Telephone Encounter (Signed)
Pt called and appointment rescheduled

## 2021-03-28 ENCOUNTER — Ambulatory Visit: Payer: Medicare Other | Admitting: Family Medicine

## 2021-03-28 ENCOUNTER — Telehealth: Payer: Self-pay | Admitting: *Deleted

## 2021-03-28 NOTE — Telephone Encounter (Signed)
Patient has a new patient appt on 04/12/21.  May need to schedule for VV.

## 2021-03-28 NOTE — Telephone Encounter (Signed)
Call Type Triage / Clinical Relationship To Patient Self Return Phone Number [phone] (Primary) Chief Complaint Headache Reason for Call Symptomatic / Request for Terry Myers states his wife tested positive earlier this week, has headache and start of symptoms Translation No Nurse Assessment Nurse: Ardine Bjork, RN, Melissa Date/Time (Eastern Time): 03/26/2021 2:53:10 PM Confirm and document reason for call. If symptomatic, describe symptoms. ---Caller states his wife tested positive earlier this week. Pt now has headache, fatigue, body aches , wet cough(brown), low-grade fever 100.0 orally, diarrhea and start of symptoms-sxs started yesterday and worse today. Denies vomiting or sob.

## 2021-04-12 ENCOUNTER — Encounter: Payer: Self-pay | Admitting: Family Medicine

## 2021-04-12 ENCOUNTER — Ambulatory Visit (INDEPENDENT_AMBULATORY_CARE_PROVIDER_SITE_OTHER): Payer: Medicare Other | Admitting: Family Medicine

## 2021-04-12 ENCOUNTER — Other Ambulatory Visit: Payer: Self-pay

## 2021-04-12 VITALS — BP 130/72 | HR 57 | Temp 98.3°F | Ht 72.0 in | Wt 200.5 lb

## 2021-04-12 DIAGNOSIS — I825Y1 Chronic embolism and thrombosis of unspecified deep veins of right proximal lower extremity: Secondary | ICD-10-CM | POA: Diagnosis not present

## 2021-04-12 DIAGNOSIS — D6851 Activated protein C resistance: Secondary | ICD-10-CM

## 2021-04-12 DIAGNOSIS — I10 Essential (primary) hypertension: Secondary | ICD-10-CM

## 2021-04-12 DIAGNOSIS — E7849 Other hyperlipidemia: Secondary | ICD-10-CM | POA: Diagnosis not present

## 2021-04-12 NOTE — Progress Notes (Signed)
Chief Complaint  Patient presents with  . New Patient (Initial Visit)       New Patient Visit SUBJECTIVE: HPI: Terry Myers is an 75 y.o.male who is being seen for establishing care.  The patient was previously seen at [facility]. Previous PCP retired.   Hypertension Patient presents for hypertension follow up. He does monitor home blood pressures. Blood pressures ranging on average from 130's/70's. He is compliant with medication- losartan 100 mg/d. Patient has these side effects of medication: none He is adhering to a healthy diet overall. Exercise: active around house and in yard No CP or SOB.  Hyperlipidemia Patient presents for dyslipidemia follow up. Currently being treated with Lipitor 10 mg/d and compliance with treatment thus far has been good. He denies myalgias. His diet is much better since starting the medicine and he is interested in coming off of it. He has never had a heart attack or stroke. The patient is not known to have coexisting coronary artery disease.  He has a history of iron deficiency.  He was worked up by his PCP and hematologist.  No overt reason for this.  He takes both vitamin C and ferrous sulfate daily.  He would like to have his levels rechecked in August.  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  He follows with a vascular surgeon for this.  Patient has a history of prostate cancer.  He received brachytherapy in 2015.  He takes Uroxatrol 10 mg daily by the urology team for which he follows routinely.  Past Medical History:  Diagnosis Date  . Depression   . DVT (deep venous thrombosis) ([place]) 2011   "LLE"  . Gout   . High cholesterol   . Hypertension   . 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 Prostate cancer ([place]) dx'd 06/2011  . Sleep apnea    "wore mask a long time ago"   Past Surgical History:  Procedure Laterality Date  . BACK SURGERY    .  POSTERIOR CERVICAL FUSION/FORAMINOTOMY  2005   C3-C7  . PROSTATE BIOPSY  06/2011; 01/2014  . TONSILLECTOMY  1950's   Family History  Problem Relation Age of Onset  . Cancer Mother   . Heart attack Brother    No Known Allergies  Current Outpatient Medications:  .  alfuzosin (UROXATRAL) 10 MG 24 hr tablet, Take 10 mg by mouth daily with breakfast., Disp: , Rfl:  .  Ascorbic Acid (VITAMIN C) 1000 MG tablet, Take 1,000 mg by mouth daily., Disp: , Rfl:  .  Cholecalciferol (VITAMIN D3) 125 MCG (5000 UT) TABS, Take by mouth., Disp: , Rfl:  .  ferrous sulfate 325 (65 FE) MG tablet, Take 325 mg by mouth daily with breakfast., Disp: , Rfl:  .  losartan (COZAAR) 100 MG tablet, Take 100 mg by mouth daily., Disp: , Rfl:  .  rivaroxaban (XARELTO) 10 MG TABS tablet, Take 10 mg by mouth daily., Disp: , Rfl:  .  vitamin B-12 (CYANOCOBALAMIN) 1000 MCG tablet, Take 1,000 mcg by mouth daily., Disp: , Rfl:   OBJECTIVE: BP 130/72 (BP Location: Left Arm, Patient Position: Sitting, Cuff Size: Normal)   Pulse (!) 57   Temp 98.3 F (36.8 C) (Oral)   Ht 6' (1.829 m)   Wt 200 lb 8 oz (90.9 kg)   SpO2 97%   BMI 27.19 kg/m  General:  well developed, well  nourished, in no apparent distress Skin:  no significant moles, warts, or growths Lungs:  clear to auscultation, breath sounds equal bilaterally, no respiratory distress Cardio:  regular rate and rhythm, no LE edema or bruits Neuro:  gait normal Psych: well oriented with normal range of affect and appropriate judgment/insight  ASSESSMENT/PLAN: Essential hypertension  Other hyperlipidemia  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  Chronic, stable.  Continue losartan 100 mg daily.  Counseled on diet and exercise.  Does not need to monitor blood pressure at home. 2.  We will stop Lipitor 10 mg daily for now.  We will recheck labs in 3 months for his annual visit.  This is  primary prevention.  We will calculate his 10-year CVD risk at that time. 3/4.  He follows with other vascular surgery for these things.  Continue Xarelto 10 mg daily. Patient should return in 3 months for a physical. The patient voiced understanding and agreement to the plan.   [place], DO 04/12/21  4:57 PM

## 2021-04-12 NOTE — Patient Instructions (Addendum)
Nice meeting you today.   OK to stop the Lipitor for now. We will check your labs in August.   Keep the diet clean and stay active.  Let us know if you need anything.

## 2021-04-14 NOTE — Telephone Encounter (Signed)
Already updated immunizations , please send back exercise for patient , was unaware on location of pain nor did I see it mentioned in AVS  so I didn't use the smart phrase in epic

## 2021-05-24 ENCOUNTER — Ambulatory Visit (INDEPENDENT_AMBULATORY_CARE_PROVIDER_SITE_OTHER): Payer: Medicare Other | Admitting: Family Medicine

## 2021-05-24 ENCOUNTER — Encounter: Payer: Self-pay | Admitting: Family Medicine

## 2021-05-24 ENCOUNTER — Other Ambulatory Visit: Payer: Self-pay

## 2021-05-24 VITALS — BP 132/72 | HR 93 | Temp 98.2°F | Ht 72.0 in | Wt 203.2 lb

## 2021-05-24 DIAGNOSIS — G8929 Other chronic pain: Secondary | ICD-10-CM | POA: Diagnosis not present

## 2021-05-24 DIAGNOSIS — R29898 Other symptoms and signs involving the musculoskeletal system: Secondary | ICD-10-CM

## 2021-05-24 DIAGNOSIS — M545 Low back pain, unspecified: Secondary | ICD-10-CM

## 2021-05-24 MED ORDER — TRAMADOL HCL 50 MG PO TABS: 50.0000 mg | ORAL_TABLET | Freq: Three times a day (TID) | ORAL | 0 refills | Status: AC | PRN

## 2021-05-24 MED ORDER — DICLOFENAC SODIUM 1 % EX GEL
2.0000 g | Freq: Four times a day (QID) | CUTANEOUS | 1 refills | Status: DC
Start: 2021-05-24 — End: 2021-08-01

## 2021-05-24 NOTE — Patient Instructions (Addendum)
Heat (pad or rice pillow in microwave) over affected area, 10-15 minutes twice daily.   Ice/cold pack over area for 10-15 min twice daily.  OK to take Tylenol 1000 mg (2 extra strength tabs) or 975 mg (3 regular strength tabs) every 6 hours as needed.  Give me a week to set up the MRI. Send me a message if you don't hear anything.  Consider Voltaren gel 4 times daily as needed. This is available over the counter but compare prices with what I send in.   Do not drink alcohol, do any illicit/street drugs, drive or do anything that requires alertness while on this medicine.   Let us know if you need anything.

## 2021-05-24 NOTE — Progress Notes (Signed)
Chief Complaint  Patient presents with   Leg Pain    Left leg pain     Subjective: Patient is a 75 y.o. male here for f/u back pain.  At the end of May, the patient was given stretches and exercises for chronic low back pain.  While his flexibility has improved, his back pain remains unchanged.  He is now starting to have left lower extremity weakness, tingling, and pain.  No recent injury or change in activity that exacerbated things.  Denies any bruising, redness, swelling, or bowel/bladder incontinence.  Tylenol provides minimal relief.  He takes Xarelto daily for chronic DVT so is not taking any oral anti-inflammatories.  Past Medical History:  Diagnosis Date   Depression    DVT (deep venous thrombosis) ([place]) 2011   "LLE"   Gout    High cholesterol    Hypertension    Iron deficiency    Idiopathic, evaluated by hematology   Prostate cancer ([place]) dx'd 06/2011   Sleep apnea    "wore mask a long time ago"    Objective: BP 132/72   Pulse 93   Temp 98.2 F (36.8 C) (Oral)   Ht 6' (1.829 m)   Wt 203 lb 4 oz (92.2 kg)   SpO2 96%   BMI 27.57 kg/m  General: Awake, appears stated age MSK: No ttp or deformity, no atrophy Neuro: Nml gait, DTR's equal and symmetric w/o clonus. 4/5 strength with hip flexion, knee flexion/ext on L; 5/5 strength on R.  Lungs: No accessory muscle use Psych: Age appropriate judgment and insight, normal affect and mood  Assessment and Plan: Chronic left-sided low back pain without sciatica - Plan: MR Lumbar Spine Wo Contrast, diclofenac Sodium (VOLTAREN) 1 % GEL, traMADol (ULTRAM) 50 MG tablet  Left leg weakness - Plan: MR Lumbar Spine Wo Contrast  Chronic, worsening. Ck MRI to rule out nerve impingement. Trial topical NSAID as he is on Xarelto. Cont Tylenol. Tramadol prn. Warnings about this verbalized and written down. Ice, heat. F/u as originally scheduled.  The patient voiced understanding and agreement to the plan.  [place],  DO 05/24/21  3:51 PM

## 2021-05-28 ENCOUNTER — Ambulatory Visit (HOSPITAL_BASED_OUTPATIENT_CLINIC_OR_DEPARTMENT_OTHER)
Admission: RE | Admit: 2021-05-28 | Discharge: 2021-05-28 | Disposition: A | Discharge status: Home / Self Care | Payer: Medicare Other | Source: Ambulatory Visit | Attending: Family Medicine | Admitting: Family Medicine

## 2021-05-28 ENCOUNTER — Other Ambulatory Visit: Payer: Self-pay

## 2021-05-28 DIAGNOSIS — M545 Low back pain, unspecified: Secondary | ICD-10-CM | POA: Insufficient documentation

## 2021-05-28 DIAGNOSIS — R29898 Other symptoms and signs involving the musculoskeletal system: Secondary | ICD-10-CM

## 2021-05-28 DIAGNOSIS — G8929 Other chronic pain: Secondary | ICD-10-CM | POA: Diagnosis present

## 2021-05-30 ENCOUNTER — Other Ambulatory Visit: Payer: Self-pay | Admitting: Family Medicine

## 2021-05-30 DIAGNOSIS — G8929 Other chronic pain: Secondary | ICD-10-CM

## 2021-05-30 DIAGNOSIS — R29898 Other symptoms and signs involving the musculoskeletal system: Secondary | ICD-10-CM

## 2021-05-30 DIAGNOSIS — M545 Low back pain, unspecified: Secondary | ICD-10-CM

## 2021-06-07 ENCOUNTER — Encounter: Payer: Self-pay | Admitting: Physical Medicine & Rehabilitation

## 2021-07-13 ENCOUNTER — Ambulatory Visit (INDEPENDENT_AMBULATORY_CARE_PROVIDER_SITE_OTHER): Payer: Medicare Other | Admitting: Family Medicine

## 2021-07-13 ENCOUNTER — Encounter: Payer: Self-pay | Admitting: Family Medicine

## 2021-07-13 ENCOUNTER — Other Ambulatory Visit: Payer: Self-pay

## 2021-07-13 VITALS — BP 118/66 | HR 56 | Temp 98.9°F | Ht 72.0 in | Wt 203.4 lb

## 2021-07-13 DIAGNOSIS — D509 Iron deficiency anemia, unspecified: Secondary | ICD-10-CM

## 2021-07-13 DIAGNOSIS — I10 Essential (primary) hypertension: Secondary | ICD-10-CM | POA: Diagnosis not present

## 2021-07-13 DIAGNOSIS — E559 Vitamin D deficiency, unspecified: Secondary | ICD-10-CM | POA: Diagnosis not present

## 2021-07-13 DIAGNOSIS — E7849 Other hyperlipidemia: Secondary | ICD-10-CM | POA: Diagnosis not present

## 2021-07-13 DIAGNOSIS — Z0000 Encounter for general adult medical examination without abnormal findings: Secondary | ICD-10-CM | POA: Diagnosis not present

## 2021-07-13 DIAGNOSIS — Z1159 Encounter for screening for other viral diseases: Secondary | ICD-10-CM

## 2021-07-13 NOTE — Progress Notes (Signed)
Chief Complaint  Patient presents with   Annual Exam    Well Male Terry Myers is here for a complete physical.   His last physical was >1 year ago.  Current diet: in general, a "healthy" diet.   Current exercise: walking, active around house Weight trend: stable Fatigue out of ordinary? No. Seat belt? Yes.    Health maintenance Shingrix- No Colonoscopy- Yes Tetanus- Yes Hep C- Yes Pneumonia vaccine- Yes  Past Medical History:  Diagnosis Date   Depression    DVT (deep venous thrombosis) ([place]) 2011   "LLE"   Gout    High cholesterol    Hypertension    Iron deficiency    Idiopathic, evaluated by hematology   Prostate cancer ([place]) dx'd 06/2011   Sleep apnea    "wore mask a long time ago"     Past Surgical History:  Procedure Laterality Date   BACK SURGERY     POSTERIOR CERVICAL FUSION/FORAMINOTOMY  2005   C3-C7   PROSTATE BIOPSY  06/2011; 01/2014   TONSILLECTOMY  1950's    Medications  Current Outpatient Medications on File Prior to Visit  Medication Sig Dispense Refill   alfuzosin (UROXATRAL) 10 MG 24 hr tablet Take 10 mg by mouth daily with breakfast.     Ascorbic Acid (VITAMIN C) 1000 MG tablet Take 1,000 mg by mouth daily.     diclofenac Sodium (VOLTAREN) 1 % GEL Apply 2 g topically 4 (four) times daily. 100 g 1   losartan (COZAAR) 100 MG tablet Take 100 mg by mouth daily.     rivaroxaban (XARELTO) 10 MG TABS tablet Take 10 mg by mouth daily.     vitamin B-12 (CYANOCOBALAMIN) 1000 MCG tablet Take 1,000 mcg by mouth daily.      Allergies No Known Allergies  Family History Family History  Problem Relation Age of Onset   Cancer Mother    Heart attack Brother     Review of Systems: Constitutional:  no fevers Eye:  no recent significant change in vision Ears:  No changes in hearing Nose/Mouth/Throat:  no complaints of nasal congestion, no sore throat Cardiovascular: no chest pain Respiratory:  No shortness of breath Gastrointestinal:  No change in  bowel habits GU:  No frequency Integumentary:  no abnormal skin lesions reported Neurologic:  no headaches Endocrine:  denies unexplained weight changes  Exam BP 118/66   Pulse (!) 56   Temp 98.9 F (37.2 C) (Oral)   Ht 6' (1.829 m)   Wt 203 lb 6 oz (92.3 kg)   SpO2 96%   BMI 27.58 kg/m  General:  well developed, well nourished, in no apparent distress Skin:  no significant moles, warts, or growths Head:  no masses, lesions, or tenderness Eyes:  pupils equal and round, sclera anicteric without injection Ears:  canals without lesions, TMs shiny without retraction, no obvious effusion, no erythema Nose:  nares patent, septum midline, mucosa normal Throat/Pharynx:  lips and gingiva without lesion; tongue and uvula midline; non-inflamed pharynx; no exudates or postnasal drainage Lungs:  clear to auscultation, breath sounds equal bilaterally, no respiratory distress Cardio:  bradycardic, regular rhythm, no LE edema or bruits Rectal: Deferred GI: BS+, S, NT, ND, no masses or organomegaly Musculoskeletal:  symmetrical muscle groups noted without atrophy or deformity Neuro:  gait normal; deep tendon reflexes normal and symmetric Psych: well oriented with normal range of affect and appropriate judgment/insight  Assessment and Plan  Well adult exam  Iron deficiency anemia, unspecified iron deficiency anemia  type - Plan: CBC w/Diff, IBC + Ferritin  Essential hypertension  Other hyperlipidemia - Plan: Lipid panel, Comprehensive metabolic panel  Encounter for hepatitis C screening test for low risk patient - Plan: Hepatitis C antibody  Vitamin D insufficiency - Plan: VITAMIN D 25 Hydroxy (Vit-D Deficiency, Fractures)   Well 75 y.o. male. Counseled on diet and exercise. Other orders as above. Shingrix rec'd.  Flu shot rec'd in mid Oct. Follow up in 6 mo or prn.  The patient voiced understanding and agreement to the plan.  [place], DO 07/13/21 3:06 PM

## 2021-07-13 NOTE — Patient Instructions (Addendum)
Give [place] 2-3 business days to get the results of your labs back.   Keep the diet clean and stay active.  I recommend getting the flu shot in mid October. This suggestion would change if the CDC comes out with a different recommendation.   The new Shingrix vaccine (for shingles) is a 2 shot series. It can make people feel low energy, achy and almost like they have the flu for 48 hours after injection. Please plan accordingly when deciding on when to get this shot. Call your pharmacy for an appointment to get this. The second shot of the series is less severe regarding the side effects, but it still lasts 48 hours.   Let us know if you need anything.

## 2021-07-14 LAB — COMPREHENSIVE METABOLIC PANEL
ALT: 12 U/L (ref 0–53)
AST: 14 U/L (ref 0–37)
Albumin: 4.2 g/dL (ref 3.5–5.2)
Alkaline Phosphatase: 42 U/L (ref 39–117)
BUN: 13 mg/dL (ref 6–23)
CO2: 29 mEq/L (ref 19–32)
Calcium: 9.6 mg/dL (ref 8.4–10.5)
Chloride: 103 mEq/L (ref 96–112)
Creatinine, Ser: 1 mg/dL (ref 0.40–1.50)
GFR: 73.79 mL/min (ref >60.00)
Glucose, Bld: 74 mg/dL (ref 70–99)
Potassium: 4.6 mEq/L (ref 3.5–5.1)
Sodium: 139 mEq/L (ref 135–145)
Total Bilirubin: 1 mg/dL (ref 0.2–1.2)
Total Protein: 6.8 g/dL (ref 6.0–8.3)

## 2021-07-14 LAB — IBC + FERRITIN
Ferritin: 65.2 ng/mL (ref 22.0–322.0)
Iron: 141 ug/dL (ref 42–165)
Saturation Ratios: 40.4 % (ref 20.0–50.0)
TIBC: 348.6 ug/dL (ref 250.0–450.0)
Transferrin: 249 mg/dL (ref 212.0–360.0)

## 2021-07-14 LAB — CBC WITH DIFFERENTIAL/PLATELET
Basophils Absolute: 0 10*3/uL (ref 0.0–0.1)
Basophils Relative: 0.3 % (ref 0.0–3.0)
Eosinophils Absolute: 0.3 10*3/uL (ref 0.0–0.7)
Eosinophils Relative: 4.7 % (ref 0.0–5.0)
HCT: 42.3 % (ref 39.0–52.0)
Hemoglobin: 13.9 g/dL (ref 13.0–17.0)
Lymphocytes Relative: 22.1 % (ref 12.0–46.0)
Lymphs Abs: 1.2 10*3/uL (ref 0.7–4.0)
MCHC: 32.9 g/dL (ref 30.0–36.0)
MCV: 98.2 fl (ref 78.0–100.0)
Monocytes Absolute: 0.5 10*3/uL (ref 0.1–1.0)
Monocytes Relative: 9.5 % (ref 3.0–12.0)
Neutro Abs: 3.5 10*3/uL (ref 1.4–7.7)
Neutrophils Relative %: 63.4 % (ref 43.0–77.0)
Platelets: 190 10*3/uL (ref 150.0–400.0)
RBC: 4.31 Mil/uL (ref 4.22–5.81)
RDW: 14.4 % (ref 11.5–15.5)
WBC: 5.5 10*3/uL (ref 4.0–10.5)

## 2021-07-14 LAB — HEPATITIS C ANTIBODY
Hepatitis C Ab: NONREACTIVE
SIGNAL TO CUT-OFF: 0.01 (ref <1.00)

## 2021-07-14 LAB — VITAMIN D 25 HYDROXY (VIT D DEFICIENCY, FRACTURES): VITD: 27.75 ng/mL — ABNORMAL LOW (ref 30.00–100.00)

## 2021-07-14 LAB — LIPID PANEL
Cholesterol: 176 mg/dL (ref 0–200)
HDL: 39.7 mg/dL (ref >39.00)
LDL Cholesterol: 107 mg/dL — ABNORMAL HIGH (ref 0–99)
NonHDL: 136.29
Total CHOL/HDL Ratio: 4
Triglycerides: 145 mg/dL (ref 0.0–149.0)
VLDL: 29 mg/dL (ref 0.0–40.0)

## 2021-08-01 ENCOUNTER — Other Ambulatory Visit: Payer: Self-pay

## 2021-08-01 ENCOUNTER — Ambulatory Visit (INDEPENDENT_AMBULATORY_CARE_PROVIDER_SITE_OTHER): Payer: Medicare Other | Admitting: Family Medicine

## 2021-08-01 ENCOUNTER — Encounter: Payer: Self-pay | Admitting: Family Medicine

## 2021-08-01 VITALS — BP 120/72 | HR 79 | Temp 98.4°F | Ht 72.0 in | Wt 204.5 lb

## 2021-08-01 DIAGNOSIS — I6521 Occlusion and stenosis of right carotid artery: Secondary | ICD-10-CM | POA: Diagnosis not present

## 2021-08-01 MED ORDER — ROSUVASTATIN CALCIUM 10 MG PO TABS: 10.0000 mg | ORAL_TABLET | Freq: Every day | ORAL | 3 refills | Status: AC

## 2021-08-01 NOTE — Patient Instructions (Addendum)
Stay hydrated.   Keep the diet clean and stay active.  Let us know if you need anything.

## 2021-08-01 NOTE — Progress Notes (Signed)
Chief Complaint  Patient presents with   Results    Discuss     Subjective: Patient is a 75 y.o. male here for f/u imaging results.  Pt is in an Alzheimer's study thru [facility]. He gets yearly carotid scans. I had taken him off of his statin as his cholesterol looked good off of it. Recently we received his yearly results showing stable 2 mm plaque in the R carotid. No hx of bruits and no vision changes. He takes Xarelto for recurrent DVT.   Past Medical History:  Diagnosis Date   Depression    DVT (deep venous thrombosis) ([place]) 2011   "LLE"   Gout    High cholesterol    Hypertension    Iron deficiency    Idiopathic, evaluated by hematology   Prostate cancer ([place]) dx'd 06/2011   Sleep apnea    "wore mask a long time ago"    Objective: BP 120/72   Pulse 79   Temp 98.4 F (36.9 C) (Oral)   Ht 6' (1.829 m)   Wt 204 lb 8 oz (92.8 kg)   SpO2 96%   BMI 27.74 kg/m  General: Awake, appears stated age Heart: RRR, no murmurs, I did not hear any bruits Lungs: CTAB, no rales, wheezes or rhonchi. No accessory muscle use Psych: Age appropriate judgment and insight, normal affect and mood  Assessment and Plan: Atherosclerosis of right carotid artery - Plan: rosuvastatin (CRESTOR) 10 MG tablet  Chronic, uncontrolled. Go back on Crestor. Ck labs in 6 weeks. Counseled on diet/exercise. Discussed ASA, decided not worth risk given chronic Xarelto use. F/u as originally scheduled.  The patient voiced understanding and agreement to the plan.  [place], DO 08/01/21  1:39 PM

## 2021-08-09 ENCOUNTER — Ambulatory Visit: Payer: Medicare Other | Admitting: Physical Medicine & Rehabilitation

## 2021-08-31 ENCOUNTER — Other Ambulatory Visit: Payer: Self-pay | Admitting: Family Medicine

## 2021-08-31 DIAGNOSIS — H9190 Unspecified hearing loss, unspecified ear: Secondary | ICD-10-CM

## 2021-09-13 ENCOUNTER — Other Ambulatory Visit: Payer: Self-pay

## 2021-09-13 ENCOUNTER — Other Ambulatory Visit (INDEPENDENT_AMBULATORY_CARE_PROVIDER_SITE_OTHER): Payer: Medicare Other

## 2021-09-13 DIAGNOSIS — I6521 Occlusion and stenosis of right carotid artery: Secondary | ICD-10-CM

## 2021-09-13 LAB — LIPID PANEL
Cholesterol: 122 mg/dL (ref 0–200)
HDL: 40.3 mg/dL (ref >39.00)
LDL Cholesterol: 62 mg/dL (ref 0–99)
NonHDL: 81.29
Total CHOL/HDL Ratio: 3
Triglycerides: 98 mg/dL (ref 0.0–149.0)
VLDL: 19.6 mg/dL (ref 0.0–40.0)

## 2021-09-13 LAB — HEPATIC FUNCTION PANEL
ALT: 16 U/L (ref 0–53)
AST: 17 U/L (ref 0–37)
Albumin: 4.2 g/dL (ref 3.5–5.2)
Alkaline Phosphatase: 45 U/L (ref 39–117)
Bilirubin, Direct: 0.2 mg/dL (ref 0.0–0.3)
Total Bilirubin: 0.9 mg/dL (ref 0.2–1.2)
Total Protein: 6.4 g/dL (ref 6.0–8.3)

## 2021-09-21 ENCOUNTER — Telehealth: Payer: Self-pay | Admitting: Family Medicine

## 2021-09-21 NOTE — Telephone Encounter (Signed)
Left message for patient to call back and schedule Medicare Annual Wellness Visit (AWV) in office.   If not able to come in office, please offer to do virtually or by telephone.  Left office number and my jabber [phone].  Last AWV: 03/14/2019  Please schedule at anytime with Nurse Health Advisor.

## 2021-11-01 ENCOUNTER — Encounter: Payer: Self-pay | Admitting: Family Medicine

## 2021-11-02 MED ORDER — LOSARTAN POTASSIUM 100 MG PO TABS: 100.0000 mg | ORAL_TABLET | Freq: Every day | ORAL | 1 refills | Status: AC

## 2021-11-02 MED ORDER — ALFUZOSIN HCL ER 10 MG PO TB24: 10.0000 mg | ORAL_TABLET | Freq: Every day | ORAL | 1 refills | Status: AC

## 2021-11-04 ENCOUNTER — Encounter: Payer: Self-pay | Admitting: Family Medicine

## 2021-11-16 DIAGNOSIS — M5417 Radiculopathy, lumbosacral region: Secondary | ICD-10-CM | POA: Diagnosis not present

## 2021-11-21 DIAGNOSIS — K200 Eosinophilic esophagitis: Secondary | ICD-10-CM | POA: Insufficient documentation

## 2021-11-21 DIAGNOSIS — K5282 Eosinophilic colitis: Secondary | ICD-10-CM | POA: Diagnosis not present

## 2021-12-12 DIAGNOSIS — G4733 Obstructive sleep apnea (adult) (pediatric): Secondary | ICD-10-CM | POA: Diagnosis not present

## 2021-12-27 ENCOUNTER — Ambulatory Visit (INDEPENDENT_AMBULATORY_CARE_PROVIDER_SITE_OTHER): Payer: Medicare Other

## 2021-12-27 VITALS — Ht 72.0 in | Wt 204.0 lb

## 2021-12-27 DIAGNOSIS — Z0000 Encounter for general adult medical examination without abnormal findings: Secondary | ICD-10-CM

## 2021-12-27 NOTE — Patient Instructions (Signed)
Terry Myers , Thank you for taking time to complete your Medicare Wellness Visit. I appreciate your ongoing commitment to your health goals. Please review the following plan we discussed and let me know if I can assist you in the future.   Screening recommendations/referrals: Colonoscopy: Completed 07/13/2017-Due 07/14/2027 Recommended yearly ophthalmology/optometry visit for glaucoma screening and checkup Recommended yearly dental visit for hygiene and checkup  Vaccinations: Influenza vaccine: Up to date Pneumococcal vaccine: Up to date Tdap vaccine: Up to date Shingles vaccine: Declined   Covid-19: Up to date  Advanced directives: Information mailed today  Conditions/risks identified: See problem list  Next appointment: Follow up in one year for your annual wellness visit.   Preventive Care 39 Years and Older, Male Preventive care refers to lifestyle choices and visits with your health care provider that can promote health and wellness. What does preventive care include? A yearly physical exam. This is also called an annual well check. Dental exams once or twice a year. Routine eye exams. Ask your health care provider how often you should have your eyes checked. Personal lifestyle choices, including: Daily care of your teeth and gums. Regular physical activity. Eating a healthy diet. Avoiding tobacco and drug use. Limiting alcohol use. Practicing safe sex. Taking low doses of aspirin every day. Taking vitamin and mineral supplements as recommended by your health care provider. What happens during an annual well check? The services and screenings done by your health care provider during your annual well check will depend on your age, overall health, lifestyle risk factors, and family history of disease. Counseling  Your health care provider may ask you questions about your: Alcohol use. Tobacco use. Drug use. Emotional well-being. Home and relationship well-being. Sexual  activity. Eating habits. History of falls. Memory and ability to understand (cognition). Work and work Statistician. Screening  You may have the following tests or measurements: Height, weight, and BMI. Blood pressure. Lipid and cholesterol levels. These may be checked every 5 years, or more frequently if you are over 46 years old. Skin check. Lung cancer screening. You may have this screening every year starting at age 22 if you have a 30-pack-year history of smoking and currently smoke or have quit within the past 15 years. Fecal occult blood test (FOBT) of the stool. You may have this test every year starting at age 92. Flexible sigmoidoscopy or colonoscopy. You may have a sigmoidoscopy every 5 years or a colonoscopy every 10 years starting at age 17. Prostate cancer screening. Recommendations will vary depending on your family history and other risks. Hepatitis C blood test. Hepatitis B blood test. Sexually transmitted disease (STD) testing. Diabetes screening. This is done by checking your blood sugar (glucose) after you have not eaten for a while (fasting). You may have this done every 1-3 years. Abdominal aortic aneurysm (AAA) screening. You may need this if you are a current or former smoker. Osteoporosis. You may be screened starting at age 74 if you are at high risk. Talk with your health care provider about your test results, treatment options, and if necessary, the need for more tests. Vaccines  Your health care provider may recommend certain vaccines, such as: Influenza vaccine. This is recommended every year. Tetanus, diphtheria, and acellular pertussis (Tdap, Td) vaccine. You may need a Td booster every 10 years. Zoster vaccine. You may need this after age 24. Pneumococcal 13-valent conjugate (PCV13) vaccine. One dose is recommended after age 60. Pneumococcal polysaccharide (PPSV23) vaccine. One dose is recommended after age  7. Talk to your health care provider about which  screenings and vaccines you need and how often you need them. This information is not intended to replace advice given to you by your health care provider. Make sure you discuss any questions you have with your health care provider. Document Released: 11/26/2015 Document Revised: 07/19/2016 Document Reviewed: 08/31/2015 Elsevier Interactive Patient Education  2017 [place] Prevention in the Home Falls can cause injuries. They can happen to people of all ages. There are many things you can do to make your home safe and to help prevent falls. What can I do on the outside of my home? Regularly fix the edges of walkways and driveways and fix any cracks. Remove anything that might make you trip as you walk through a door, such as a raised step or threshold. Trim any bushes or trees on the path to your home. Use bright outdoor lighting. Clear any walking paths of anything that might make someone trip, such as rocks or tools. Regularly check to see if handrails are loose or broken. Make sure that both sides of any steps have handrails. Any raised decks and porches should have guardrails on the edges. Have any leaves, snow, or ice cleared regularly. Use sand or salt on walking paths during winter. Clean up any spills in your garage right away. This includes oil or grease spills. What can I do in the bathroom? Use night lights. Install grab bars by the toilet and in the tub and shower. Do not use towel bars as grab bars. Use non-skid mats or decals in the tub or shower. If you need to sit down in the shower, use a plastic, non-slip stool. Keep the floor dry. Clean up any water that spills on the floor as soon as it happens. Remove soap buildup in the tub or shower regularly. Attach bath mats securely with double-sided non-slip rug tape. Do not have throw rugs and other things on the floor that can make you trip. What can I do in the bedroom? Use night lights. Make sure that you have a  light by your bed that is easy to reach. Do not use any sheets or blankets that are too big for your bed. They should not hang down onto the floor. Have a firm chair that has side arms. You can use this for support while you get dressed. Do not have throw rugs and other things on the floor that can make you trip. What can I do in the kitchen? Clean up any spills right away. Avoid walking on wet floors. Keep items that you use a lot in easy-to-reach places. If you need to reach something above you, use a strong step stool that has a grab bar. Keep electrical cords out of the way. Do not use floor polish or wax that makes floors slippery. If you must use wax, use non-skid floor wax. Do not have throw rugs and other things on the floor that can make you trip. What can I do with my stairs? Do not leave any items on the stairs. Make sure that there are handrails on both sides of the stairs and use them. Fix handrails that are broken or loose. Make sure that handrails are as long as the stairways. Check any carpeting to make sure that it is firmly attached to the stairs. Fix any carpet that is loose or worn. Avoid having throw rugs at the top or bottom of the stairs. If you do have  throw rugs, attach them to the floor with carpet tape. Make sure that you have a light switch at the top of the stairs and the bottom of the stairs. If you do not have them, ask someone to add them for you. What else can I do to help prevent falls? Wear shoes that: Do not have high heels. Have rubber bottoms. Are comfortable and fit you well. Are closed at the toe. Do not wear sandals. If you use a stepladder: Make sure that it is fully opened. Do not climb a closed stepladder. Make sure that both sides of the stepladder are locked into place. Ask someone to hold it for you, if possible. Clearly mark and make sure that you can see: Any grab bars or handrails. First and last steps. Where the edge of each step  is. Use tools that help you move around (mobility aids) if they are needed. These include: Canes. Walkers. Scooters. Crutches. Turn on the lights when you go into a dark area. Replace any light bulbs as soon as they burn out. Set up your furniture so you have a clear path. Avoid moving your furniture around. If any of your floors are uneven, fix them. If there are any pets around you, be aware of where they are. Review your medicines with your doctor. Some medicines can make you feel dizzy. This can increase your chance of falling. Ask your doctor what other things that you can do to help prevent falls. This information is not intended to replace advice given to you by your health care provider. Make sure you discuss any questions you have with your health care provider. Document Released: 08/26/2009 Document Revised: 04/06/2016 Document Reviewed: 12/04/2014 Elsevier Interactive Patient Education  2017 [employer].

## 2021-12-27 NOTE — Progress Notes (Signed)
Subjective:   Terry Myers is a 76 y.o. male who presents for an Initial Medicare Annual Wellness Visit.  I connected with Kadeem today by telephone and verified that I am speaking with the correct person using two identifiers. Location patient: home Location provider: work Persons participating in the virtual visit: patient, Marine scientist.    I discussed the limitations, risks, security and privacy concerns of performing an evaluation and management service by telephone and the availability of in person appointments. I also discussed with the patient that there may be a patient responsible charge related to this service. The patient expressed understanding and verbally consented to this telephonic visit.    Interactive audio and video telecommunications were attempted between this provider and patient, however failed, due to patient having technical difficulties OR patient did not have access to video capability.  We continued and completed visit with audio only.  Some vital signs may be absent or patient reported.   Time Spent with patient on telephone encounter: 25 minutes   Review of Systems     Cardiac Risk Factors include: male gender;hypertension;advanced age (>58men, >86 women)     Objective:    Today's Vitals   12/27/21 1142  Weight: 204 lb (92.5 kg)  Height: 6' (1.829 m)   Body mass index is 27.67 kg/m.  Advanced Directives 12/27/2021 02/24/2014  Does Patient Have a Medical Advance Directive? No Patient does not have advance directive;Patient would not like information  Would patient like information on creating a medical advance directive? Yes (MAU/Ambulatory/Procedural Areas - Information given) -  Pre-existing out of facility DNR order (yellow form or pink MOST form) - No    Current Medications (verified) Outpatient Encounter Medications as of 12/27/2021  Medication Sig   alfuzosin (UROXATRAL) 10 MG 24 hr tablet Take 1 tablet (10 mg total) by mouth daily with  breakfast.   Ascorbic Acid (VITAMIN C) 1000 MG tablet Take 1,000 mg by mouth daily.   cholecalciferol (VITAMIN D3) 25 MCG (1000 UNIT) tablet Take 1,000 Units by mouth daily.   ferrous sulfate 325 (65 FE) MG tablet    gabapentin (NEURONTIN) 300 MG capsule Take 300 mg by mouth 3 (three) times daily.   losartan (COZAAR) 100 MG tablet Take 1 tablet (100 mg total) by mouth daily.   rivaroxaban (XARELTO) 10 MG TABS tablet Take 10 mg by mouth daily.   rosuvastatin (CRESTOR) 10 MG tablet Take 1 tablet (10 mg total) by mouth daily.   vitamin B-12 (CYANOCOBALAMIN) 1000 MCG tablet Take 1,000 mcg by mouth daily.   No facility-administered encounter medications on file as of 12/27/2021.    Allergies (verified) Hydrochlorothiazide and Tamsulosin   History: Past Medical History:  Diagnosis Date   Depression    DVT (deep venous thrombosis) ([place]) 2011   "LLE"   Gout    High cholesterol    Hypertension    Iron deficiency    Idiopathic, evaluated by hematology   Prostate cancer ([place]) dx'd 06/2011   Sleep apnea    "wore mask a long time ago"   Past Surgical History:  Procedure Laterality Date   BACK SURGERY     POSTERIOR CERVICAL FUSION/FORAMINOTOMY  2005   C3-C7   PROSTATE BIOPSY  06/2011; 01/2014   TONSILLECTOMY  1950's   Family History  Problem Relation Age of Onset   Cancer Mother    Heart attack Brother    Social History   Socioeconomic History   Marital status: Married    Spouse name: Not  on file   Number of children: Not on file   Years of education: Not on file   Highest education level: Not on file  Occupational History   Not on file  Tobacco Use   Smoking status: Former    Packs/day: 2.00    Years: 10.00    Pack years: 20.00    Types: Cigarettes   Smokeless tobacco: Never   Tobacco comments:    02/24/2014 "quit smoking in the 1970's"  Substance and Sexual Activity   Alcohol use: Yes    Alcohol/week: 14.0 standard drinks    Types: 14 Glasses of wine per week     Comment: 02/24/2014 "at least 2 glasses of wine every day"   Drug use: No   Sexual activity: Yes  Other Topics Concern   Not on file  Social History Narrative   Not on file   Social Determinants of Health   Financial Resource Strain: Low Risk    Difficulty of Paying Living Expenses: Not hard at all  Food Insecurity: No Food Insecurity   Worried About Charity fundraiser in the Last Year: Never true   [place] in the Last Year: Never true  Transportation Needs: No Transportation Needs   Lack of Transportation (Medical): No   Lack of Transportation (Non-Medical): No  Physical Activity: Inactive   Days of Exercise per Week: 0 days   Minutes of Exercise per Session: 0 min  Stress: No Stress Concern Present   Feeling of Stress : Not at all  Social Connections: Socially Isolated   Frequency of Communication with Friends and Family: Once a week   Frequency of Social Gatherings with Friends and Family: Once a week   Attends Religious Services: Never   Marine scientist or Organizations: No   Attends Archivist Meetings: Never   Marital Status: Married    Tobacco Counseling Counseling given: Not Answered Tobacco comments: 02/24/2014 "quit smoking in the 1970's"   Clinical Intake:  Pre-visit preparation completed: Yes  Pain : No/denies pain     BMI - recorded: 27.67 Nutritional Status: BMI 25 -29 Overweight Nutritional Risks: None Diabetes: No  How often do you need to have someone help you when you read instructions, pamphlets, or other written materials from your doctor or pharmacy?: 1 - Never  Diabetic?No  Interpreter Needed?: No  Information entered by :: Caroleen Hamman LPN   Activities of Daily Living In your present state of health, do you have any difficulty performing the following activities: 12/27/2021 04/12/2021  Hearing? N N  Vision? N N  Difficulty concentrating or making decisions? Y N  Comment states he forgets names & an  occasional word he wants to use -  Walking or climbing stairs? N N  Dressing or bathing? N N  Doing errands, shopping? N N  Preparing Food and eating ? N -  Using the Toilet? N -  In the past six months, have you accidently leaked urine? Y -  Do you have problems with loss of bowel control? N -  Managing your Medications? N -  Managing your Finances? N -  Housekeeping or managing your Housekeeping? N -  Some recent data might be hidden    Patient Care Team: Shelda Pal, DO as PCP - General (Family Medicine) Vista Lawman Youlanda Roys, MD as Attending Physician (Family Medicine)  Indicate any recent Medical Services you may have received from other than [facility] providers in the past year (date may  be approximate).     Assessment:   This is a routine wellness examination for Terry Myers.  Hearing/Vision screen Hearing Screening - Comments:: Patient states he does have some hearing loss-has an appt with [facility] to address Vision Screening - Comments:: Last eye exam-about 6 months ago-Dr. Jac Canavan  Dietary issues and exercise activities discussed: Current Exercise Habits: The patient does not participate in regular exercise at present, Exercise limited by: None identified   Goals Addressed             This Visit's Progress    Patient Stated       Maintain current health       Depression Screen PHQ 2/9 Scores 12/27/2021 07/13/2021 04/12/2021  PHQ - 2 Score 0 0 0    Fall Risk Fall Risk  12/27/2021 07/13/2021 04/12/2021  Falls in the past year? 0 0 0  Number falls in past yr: 0 0 0  Injury with Fall? 0 0 0  Risk for fall due to : - No Fall Risks No Fall Risks  Follow up Falls prevention discussed Falls evaluation completed Falls evaluation completed    [place]:  Any stairs in or around the home? Yes  If so, are there any without handrails? Yes  Home free of loose throw rugs in walkways, pet beds, electrical cords, etc? Yes  Adequate  lighting in your home to reduce risk of falls? Yes   ASSISTIVE DEVICES UTILIZED TO PREVENT FALLS:  Life alert? No  Use of a cane, walker or w/c? No  Grab bars in the bathroom? Yes  Shower chair or bench in shower? No  Elevated toilet seat or a handicapped toilet? No   TIMED UP AND GO:  Was the test performed? No . Phone visit   Cognitive Function:Normal cognitive status assessed by this Nurse Health Advisor. No abnormalities found.          Immunizations Immunization History  Administered Date(s) Administered   Influenza, High Dose Seasonal PF 09/08/2021   PFIZER Comirnaty(Gray Top)Covid-19 Tri-Sucrose Vaccine 12/02/2019, 12/23/2019, 09/02/2020, 05/30/2021   Pfizer Covid-19 Vaccine Bivalent Booster 21yrs & up 09/08/2021   Pneumococcal Conjugate-13 01/11/2016   Pneumococcal Polysaccharide-23 04/09/2018   Tdap 12/24/2012    TDAP status: Up to date  Flu Vaccine status: Up to date  Pneumococcal vaccine status: Up to date  Covid-19 vaccine status: Completed vaccines  Qualifies for Shingles Vaccine? Yes   Zostavax completed No   Shingrix Completed?: No.    Education has been provided regarding the importance of this vaccine. Patient has been advised to call insurance company to determine out of pocket expense if they have not yet received this vaccine. Advised may also receive vaccine at local pharmacy or Health Dept. Verbalized acceptance and understanding.  Screening Tests Health Maintenance  Topic Date Due   Zoster Vaccines- Shingrix (1 of 2) Never done   TETANUS/TDAP  12/24/2022   COLONOSCOPY (Pts 45-59yrs Insurance coverage will need to be confirmed)  07/14/2027   Pneumonia Vaccine 79+ Years old  Completed   INFLUENZA VACCINE  Completed   COVID-19 Vaccine  Completed   Hepatitis C Screening  Completed   HPV VACCINES  Aged Out    Health Maintenance  Health Maintenance Due  Topic Date Due   Zoster Vaccines- Shingrix (1 of 2) Never done    Colorectal cancer  screening: Type of screening: Colonoscopy. Completed 07/13/2017. Repeat every 10 years  Lung Cancer Screening: (Low Dose CT Chest recommended if  Age 45-80 years, 30 pack-year currently smoking OR have quit w/in 15years.) does not qualify.     Additional Screening:  Hepatitis C Screening: Completed 07/13/2021  Vision Screening: Recommended annual ophthalmology exams for early detection of glaucoma and other disorders of the eye. Is the patient up to date with their annual eye exam?  Yes  Who is the provider or what is the name of the office in which the patient attends annual eye exams? Dr. Jac Canavan   Dental Screening: Recommended annual dental exams for proper oral hygiene  Community Resource Referral / Chronic Care Management: CRR required this visit?  No   CCM required this visit?  No      Plan:     I have personally reviewed and noted the following in the patients chart:   Medical and social history Use of alcohol, tobacco or illicit drugs  Current medications and supplements including opioid prescriptions. Patient is not currently taking opioid prescriptions. Functional ability and status Nutritional status Physical activity Advanced directives List of other physicians Hospitalizations, surgeries, and ER visits in previous 12 months Vitals Screenings to include cognitive, depression, and falls Referrals and appointments  In addition, I have reviewed and discussed with patient certain preventive protocols, quality metrics, and best practice recommendations. A written personalized care plan for preventive services as well as general preventive health recommendations were provided to patient.   Due to this being a telephonic visit, the after visit summary with patients personalized plan was offered to patient via mail or my-chart. Patient would like to access on my-chart.   Marta Antu, LPN   03/26/6046  Nurse Health Advisor  Nurse Notes: None

## 2022-01-09 DIAGNOSIS — G4733 Obstructive sleep apnea (adult) (pediatric): Secondary | ICD-10-CM | POA: Diagnosis not present

## 2022-01-11 ENCOUNTER — Encounter: Payer: Self-pay | Admitting: Family Medicine

## 2022-01-11 ENCOUNTER — Ambulatory Visit (INDEPENDENT_AMBULATORY_CARE_PROVIDER_SITE_OTHER): Payer: Medicare Other | Admitting: Family Medicine

## 2022-01-11 VITALS — BP 128/64 | HR 57 | Temp 98.0°F | Resp 16 | Ht 72.0 in | Wt 212.0 lb

## 2022-01-11 DIAGNOSIS — E7849 Other hyperlipidemia: Secondary | ICD-10-CM

## 2022-01-11 DIAGNOSIS — I10 Essential (primary) hypertension: Secondary | ICD-10-CM | POA: Diagnosis not present

## 2022-01-11 MED ORDER — GABAPENTIN 100 MG PO CAPS: 100.0000 mg | ORAL_CAPSULE | Freq: Three times a day (TID) | ORAL | 6 refills | Status: DC

## 2022-01-11 NOTE — Progress Notes (Signed)
Chief Complaint  ?Patient presents with  ? Follow-up  ?  Follow up on medication   ? ? ?Subjective ?Terry Myers is a 76 y.o. male who presents for hypertension follow up. ?He  rarely will  monitor home blood pressures. ?Blood pressures ranging from 130's/70's on average. ?He is compliant with medication- losartan 100 mg/d. ?Patient has these side effects of medication: none ?He is adhering to a healthy diet overall. ?Current exercise: none ?No Cp or SOB. ? ?Hyperlipidemia ?Patient presents for hyperlipidemia follow up. ?Currently being treated with Crestor 10 mg/d and compliance with treatment thus far has been good. ?He denies myalgias. ?The patient is not known to have coexisting coronary artery disease. ?  ?Past Medical History:  ?Diagnosis Date  ? Depression   ? DVT (deep venous thrombosis) ([place]) 2011  ? "LLE"  ? Gout   ? High cholesterol   ? Hypertension   ? Iron deficiency   ? Idiopathic, evaluated by hematology  ? Prostate cancer ([place]) dx'd 06/2011  ? Sleep apnea   ? "wore mask a long time ago"  ? ? ?Exam ?BP 128/64 (BP Location: Right Arm, Patient Position: Sitting, Cuff Size: Normal)   Pulse (!) 57   Temp 98 ?F (36.7 ?C) (Oral)   Resp 16   Ht 6' (1.829 m)   Wt 212 lb (96.2 kg)   SpO2 96%   BMI 28.75 kg/m?  ?General:  well developed, well nourished, in no apparent distress ?Heart: RRR, no bruits, no LE edema ?Lungs: clear to auscultation, no accessory muscle use ?Psych: well oriented with normal range of affect and appropriate judgment/insight ? ?Essential hypertension ? ?Other hyperlipidemia ? ?Chronic, stable. Cont losartan 100 mg/d. Counseled on diet and exercise. ?Chronic, stable.  Continue Crestor 10 mg daily. ?F/u in 6 mo or prn. ?The patient voiced understanding and agreement to the plan. ? ?Shelda Pal, DO ?01/11/22  ?2:25 PM ? ?

## 2022-01-11 NOTE — Patient Instructions (Signed)
Keep the diet clean and stay active.  Let us know if you need anything. 

## 2022-01-12 DIAGNOSIS — Z7901 Long term (current) use of anticoagulants: Secondary | ICD-10-CM | POA: Diagnosis not present

## 2022-01-12 DIAGNOSIS — I82551 Chronic embolism and thrombosis of right peroneal vein: Secondary | ICD-10-CM | POA: Diagnosis not present

## 2022-01-12 DIAGNOSIS — D6851 Activated protein C resistance: Secondary | ICD-10-CM | POA: Diagnosis not present

## 2022-01-12 DIAGNOSIS — I82891 Chronic embolism and thrombosis of other specified veins: Secondary | ICD-10-CM | POA: Diagnosis not present

## 2022-01-12 DIAGNOSIS — E785 Hyperlipidemia, unspecified: Secondary | ICD-10-CM | POA: Diagnosis not present

## 2022-01-12 DIAGNOSIS — Z87891 Personal history of nicotine dependence: Secondary | ICD-10-CM | POA: Diagnosis not present

## 2022-01-12 DIAGNOSIS — I825Y1 Chronic embolism and thrombosis of unspecified deep veins of right proximal lower extremity: Secondary | ICD-10-CM | POA: Diagnosis not present

## 2022-01-12 DIAGNOSIS — I10 Essential (primary) hypertension: Secondary | ICD-10-CM | POA: Diagnosis not present

## 2022-01-12 LAB — LIPID PANEL
Cholesterol: 118 mg/dL (ref 0–200)
HDL: 38.4 mg/dL — ABNORMAL LOW (ref >39.00)
LDL Cholesterol: 53 mg/dL (ref 0–99)
NonHDL: 79.14
Total CHOL/HDL Ratio: 3
Triglycerides: 133 mg/dL (ref 0.0–149.0)
VLDL: 26.6 mg/dL (ref 0.0–40.0)

## 2022-01-12 LAB — COMPREHENSIVE METABOLIC PANEL
ALT: 12 U/L (ref 0–53)
AST: 13 U/L (ref 0–37)
Albumin: 4.2 g/dL (ref 3.5–5.2)
Alkaline Phosphatase: 43 U/L (ref 39–117)
BUN: 13 mg/dL (ref 6–23)
CO2: 30 mEq/L (ref 19–32)
Calcium: 9.4 mg/dL (ref 8.4–10.5)
Chloride: 105 mEq/L (ref 96–112)
Creatinine, Ser: 1.02 mg/dL (ref 0.40–1.50)
GFR: 71.8 mL/min (ref >60.00)
Glucose, Bld: 79 mg/dL (ref 70–99)
Potassium: 4.5 mEq/L (ref 3.5–5.1)
Sodium: 141 mEq/L (ref 135–145)
Total Bilirubin: 0.9 mg/dL (ref 0.2–1.2)
Total Protein: 6.5 g/dL (ref 6.0–8.3)

## 2022-01-14 ENCOUNTER — Encounter: Payer: Self-pay | Admitting: Family Medicine

## 2022-01-16 ENCOUNTER — Other Ambulatory Visit: Payer: Self-pay | Admitting: Family Medicine

## 2022-01-16 DIAGNOSIS — D509 Iron deficiency anemia, unspecified: Secondary | ICD-10-CM

## 2022-01-16 DIAGNOSIS — E559 Vitamin D deficiency, unspecified: Secondary | ICD-10-CM

## 2022-01-24 DIAGNOSIS — H524 Presbyopia: Secondary | ICD-10-CM | POA: Diagnosis not present

## 2022-01-24 DIAGNOSIS — H11153 Pinguecula, bilateral: Secondary | ICD-10-CM | POA: Diagnosis not present

## 2022-01-24 DIAGNOSIS — H02834 Dermatochalasis of left upper eyelid: Secondary | ICD-10-CM | POA: Diagnosis not present

## 2022-01-24 DIAGNOSIS — H0288A Meibomian gland dysfunction right eye, upper and lower eyelids: Secondary | ICD-10-CM | POA: Diagnosis not present

## 2022-01-24 DIAGNOSIS — H0100A Unspecified blepharitis right eye, upper and lower eyelids: Secondary | ICD-10-CM | POA: Diagnosis not present

## 2022-01-24 DIAGNOSIS — H35033 Hypertensive retinopathy, bilateral: Secondary | ICD-10-CM | POA: Diagnosis not present

## 2022-01-24 DIAGNOSIS — H5203 Hypermetropia, bilateral: Secondary | ICD-10-CM | POA: Diagnosis not present

## 2022-01-24 DIAGNOSIS — H02831 Dermatochalasis of right upper eyelid: Secondary | ICD-10-CM | POA: Diagnosis not present

## 2022-01-24 DIAGNOSIS — H43813 Vitreous degeneration, bilateral: Secondary | ICD-10-CM | POA: Diagnosis not present

## 2022-01-24 DIAGNOSIS — H2513 Age-related nuclear cataract, bilateral: Secondary | ICD-10-CM | POA: Diagnosis not present

## 2022-01-24 DIAGNOSIS — H52203 Unspecified astigmatism, bilateral: Secondary | ICD-10-CM | POA: Diagnosis not present

## 2022-01-24 DIAGNOSIS — H0288B Meibomian gland dysfunction left eye, upper and lower eyelids: Secondary | ICD-10-CM | POA: Diagnosis not present

## 2022-01-24 DIAGNOSIS — H0100B Unspecified blepharitis left eye, upper and lower eyelids: Secondary | ICD-10-CM | POA: Diagnosis not present

## 2022-01-24 DIAGNOSIS — H53453 Other localized visual field defect, bilateral: Secondary | ICD-10-CM | POA: Diagnosis not present

## 2022-01-24 DIAGNOSIS — H268 Other specified cataract: Secondary | ICD-10-CM | POA: Diagnosis not present

## 2022-05-02 DIAGNOSIS — G4733 Obstructive sleep apnea (adult) (pediatric): Secondary | ICD-10-CM | POA: Diagnosis not present

## 2022-05-10 DIAGNOSIS — G4733 Obstructive sleep apnea (adult) (pediatric): Secondary | ICD-10-CM | POA: Diagnosis not present

## 2022-07-19 ENCOUNTER — Encounter: Payer: Medicare Other | Admitting: Family Medicine

## 2022-07-24 ENCOUNTER — Encounter: Payer: Self-pay | Admitting: Family Medicine

## 2022-07-24 ENCOUNTER — Ambulatory Visit (INDEPENDENT_AMBULATORY_CARE_PROVIDER_SITE_OTHER): Payer: Medicare Other | Admitting: Family Medicine

## 2022-07-24 VITALS — BP 120/70 | HR 59 | Temp 98.6°F | Ht 72.0 in | Wt 205.5 lb

## 2022-07-24 DIAGNOSIS — I825Y1 Chronic embolism and thrombosis of unspecified deep veins of right proximal lower extremity: Secondary | ICD-10-CM

## 2022-07-24 DIAGNOSIS — I10 Essential (primary) hypertension: Secondary | ICD-10-CM | POA: Diagnosis not present

## 2022-07-24 DIAGNOSIS — D6851 Activated protein C resistance: Secondary | ICD-10-CM

## 2022-07-24 DIAGNOSIS — D509 Iron deficiency anemia, unspecified: Secondary | ICD-10-CM | POA: Diagnosis not present

## 2022-07-24 DIAGNOSIS — Z0000 Encounter for general adult medical examination without abnormal findings: Secondary | ICD-10-CM

## 2022-07-24 DIAGNOSIS — I6521 Occlusion and stenosis of right carotid artery: Secondary | ICD-10-CM | POA: Diagnosis not present

## 2022-07-24 DIAGNOSIS — E559 Vitamin D deficiency, unspecified: Secondary | ICD-10-CM | POA: Diagnosis not present

## 2022-07-24 LAB — CBC
HCT: 39.5 % (ref 39.0–52.0)
Hemoglobin: 13.5 g/dL (ref 13.0–17.0)
MCHC: 34.1 g/dL (ref 30.0–36.0)
MCV: 97.2 fl (ref 78.0–100.0)
Platelets: 198 10*3/uL (ref 150.0–400.0)
RBC: 4.06 Mil/uL — ABNORMAL LOW (ref 4.22–5.81)
RDW: 13.8 % (ref 11.5–15.5)
WBC: 5.7 10*3/uL (ref 4.0–10.5)

## 2022-07-24 LAB — COMPREHENSIVE METABOLIC PANEL
ALT: 15 U/L (ref 0–53)
AST: 14 U/L (ref 0–37)
Albumin: 4.1 g/dL (ref 3.5–5.2)
Alkaline Phosphatase: 45 U/L (ref 39–117)
BUN: 10 mg/dL (ref 6–23)
CO2: 28 mEq/L (ref 19–32)
Calcium: 9.3 mg/dL (ref 8.4–10.5)
Chloride: 105 mEq/L (ref 96–112)
Creatinine, Ser: 1.02 mg/dL (ref 0.40–1.50)
GFR: 71.54 mL/min (ref >60.00)
Glucose, Bld: 81 mg/dL (ref 70–99)
Potassium: 4.6 mEq/L (ref 3.5–5.1)
Sodium: 139 mEq/L (ref 135–145)
Total Bilirubin: 1 mg/dL (ref 0.2–1.2)
Total Protein: 6.7 g/dL (ref 6.0–8.3)

## 2022-07-24 LAB — IBC + FERRITIN
Ferritin: 110.3 ng/mL (ref 22.0–322.0)
Iron: 147 ug/dL (ref 42–165)
Saturation Ratios: 48.4 % (ref 20.0–50.0)
TIBC: 303.8 ug/dL (ref 250.0–450.0)
Transferrin: 217 mg/dL (ref 212.0–360.0)

## 2022-07-24 LAB — LIPID PANEL
Cholesterol: 116 mg/dL (ref 0–200)
HDL: 44.2 mg/dL (ref >39.00)
LDL Cholesterol: 55 mg/dL (ref 0–99)
NonHDL: 72.26
Total CHOL/HDL Ratio: 3
Triglycerides: 88 mg/dL (ref 0.0–149.0)
VLDL: 17.6 mg/dL (ref 0.0–40.0)

## 2022-07-24 LAB — VITAMIN D 25 HYDROXY (VIT D DEFICIENCY, FRACTURES): VITD: 35.02 ng/mL (ref 30.00–100.00)

## 2022-07-24 NOTE — Progress Notes (Signed)
Chief Complaint  Patient presents with   Annual Exam    Discuss some depression and unable to stay focused to be able to read a book    Well Male Terry Myers is here for a complete physical.   His last physical was >1 year ago.  Current diet: in general, a "healthy" diet.   Current exercise: active in yard Weight trend: stable Fatigue out of ordinary? No. Seat belt? Yes.   Advanced directive? No  Health maintenance Shingrix- needs 2nd Tetanus- Yes Hep C- Yes Pneumonia vaccine- Yes  Past Medical History:  Diagnosis Date   Depression    DVT (deep venous thrombosis) ([place]) 2011   "LLE"   Gout    High cholesterol    Hypertension    Iron deficiency    Idiopathic, evaluated by hematology   Prostate cancer ([place]) dx'd 06/2011   Sleep apnea    "wore mask a long time ago"     Past Surgical History:  Procedure Laterality Date   BACK SURGERY     POSTERIOR CERVICAL FUSION/FORAMINOTOMY  2005   C3-C7   PROSTATE BIOPSY  06/2011; 01/2014   TONSILLECTOMY  1950's    Medications  Current Outpatient Medications on File Prior to Visit  Medication Sig Dispense Refill   alfuzosin (UROXATRAL) 10 MG 24 hr tablet Take 1 tablet (10 mg total) by mouth daily with breakfast. 90 tablet 1   Ascorbic Acid (VITAMIN C) 1000 MG tablet Take 1,000 mg by mouth daily.     cholecalciferol (VITAMIN D3) 25 MCG (1000 UNIT) tablet Take 1,000 Units by mouth daily.     ferrous sulfate 325 (65 FE) MG tablet      gabapentin (NEURONTIN) 100 MG capsule Take 1 capsule (100 mg total) by mouth 3 (three) times daily. 90 capsule 6   losartan (COZAAR) 100 MG tablet Take 1 tablet (100 mg total) by mouth daily. 90 tablet 1   rivaroxaban (XARELTO) 10 MG TABS tablet Take 10 mg by mouth daily.     rosuvastatin (CRESTOR) 10 MG tablet Take 1 tablet (10 mg total) by mouth daily. 90 tablet 3   vitamin B-12 (CYANOCOBALAMIN) 1000 MCG tablet Take 1,000 mcg by mouth daily.     Allergies Allergies  Allergen Reactions    Hydrochlorothiazide Rash   Tamsulosin Other (See Comments) and Palpitations    Lower back and side pain; agitation Lower back and side pain; agitation     Family History Family History  Problem Relation Age of Onset   Cancer Mother    Heart attack Brother     Review of Systems: Constitutional:  no fevers Eye:  no recent significant change in vision Ears:  No changes in hearing Nose/Mouth/Throat:  no complaints of nasal congestion, no sore throat Cardiovascular: no chest pain Respiratory:  No shortness of breath Gastrointestinal:  No change in bowel habits GU:  No frequency Integumentary:  no abnormal skin lesions reported Neurologic:  no headaches Endocrine:  denies unexplained weight changes  Exam BP 120/70   Pulse (!) 59   Temp 98.6 F (37 C) (Oral)   Ht 6' (1.829 m)   Wt 205 lb 8 oz (93.2 kg)   SpO2 93%   BMI 27.87 kg/m  General:  well developed, well nourished, in no apparent distress Skin:  no significant moles, warts, or growths Head:  no masses, lesions, or tenderness Eyes:  pupils equal and round, sclera anicteric without injection Ears:  canals without lesions, TMs shiny without retraction, no obvious  effusion, no erythema Nose:  nares patent, septum midline, mucosa normal Throat/Pharynx:  lips and gingiva without lesion; tongue and uvula midline; non-inflamed pharynx; no exudates or postnasal drainage Lungs:  clear to auscultation, breath sounds equal bilaterally, no respiratory distress Cardio:  regular rate and rhythm, no LE edema or bruits Rectal: Deferred GI: BS+, S, NT, ND, no masses or organomegaly Musculoskeletal:  symmetrical muscle groups noted without atrophy or deformity Neuro:  gait normal; deep tendon reflexes normal and symmetric Psych: well oriented with normal range of affect and appropriate judgment/insight  Assessment and Plan  Well adult exam  Vitamin D insufficiency - Plan: VITAMIN D 25 Hydroxy (Vit-D Deficiency,  Fractures)  Atherosclerosis of right carotid artery - Plan: Lipid panel, Comprehensive metabolic panel  Essential hypertension - Plan: CBC  Iron deficiency anemia, unspecified iron deficiency anemia type - Plan: IBC + Ferritin   Well 76 y.o. male. Counseled on diet and exercise. Advanced directive form provided today.  2nd Shingrix rec'd.  Other orders as above. Follow up in 1 yr.  The patient voiced understanding and agreement to the plan.  [place], DO 07/24/22 1:51 PM

## 2022-07-24 NOTE — Patient Instructions (Addendum)
Give [place] 2-3 business days to get the results of your labs back.   Keep the diet clean and stay active.  I recommend getting the flu shot in mid October. This suggestion would change if the CDC comes out with a different recommendation.   Please schedule your second Shingrix.  Please get me a copy of your advanced directive form at your convenience.   Please consider counseling. Contact [phone] to schedule an appointment or inquire about cost/insurance coverage.  [facility] located at [place], [place], [place].  Phone number = [phone].  Dr. Lennice Sites - Adult Psychiatry.    [facility] located at [place], [place], [place]. Phone number = [phone].   [facility] located at [address] [place], [place].  Phone number = [phone].   [facility] located at [place], [place], [place].  Phone number = [phone].  Let us know if you need anything.

## 2022-08-08 DIAGNOSIS — G4733 Obstructive sleep apnea (adult) (pediatric): Secondary | ICD-10-CM | POA: Diagnosis not present

## 2022-09-25 DIAGNOSIS — H268 Other specified cataract: Secondary | ICD-10-CM | POA: Diagnosis not present

## 2022-09-25 DIAGNOSIS — H02834 Dermatochalasis of left upper eyelid: Secondary | ICD-10-CM | POA: Diagnosis not present

## 2022-09-25 DIAGNOSIS — H0288A Meibomian gland dysfunction right eye, upper and lower eyelids: Secondary | ICD-10-CM | POA: Diagnosis not present

## 2022-09-25 DIAGNOSIS — H11153 Pinguecula, bilateral: Secondary | ICD-10-CM | POA: Diagnosis not present

## 2022-09-25 DIAGNOSIS — H02831 Dermatochalasis of right upper eyelid: Secondary | ICD-10-CM | POA: Diagnosis not present

## 2022-09-25 DIAGNOSIS — H0288B Meibomian gland dysfunction left eye, upper and lower eyelids: Secondary | ICD-10-CM | POA: Diagnosis not present

## 2022-09-25 DIAGNOSIS — H401414 Capsular glaucoma with pseudoexfoliation of lens, right eye, indeterminate stage: Secondary | ICD-10-CM | POA: Diagnosis not present

## 2022-09-27 ENCOUNTER — Other Ambulatory Visit: Payer: Self-pay | Admitting: Family Medicine

## 2022-09-27 ENCOUNTER — Encounter: Payer: Self-pay | Admitting: Family Medicine

## 2022-09-27 DIAGNOSIS — I6521 Occlusion and stenosis of right carotid artery: Secondary | ICD-10-CM

## 2022-09-28 DIAGNOSIS — G4733 Obstructive sleep apnea (adult) (pediatric): Secondary | ICD-10-CM | POA: Diagnosis not present

## 2022-10-14 ENCOUNTER — Ambulatory Visit (HOSPITAL_BASED_OUTPATIENT_CLINIC_OR_DEPARTMENT_OTHER)
Admission: RE | Admit: 2022-10-14 | Discharge: 2022-10-14 | Disposition: A | Discharge status: Home / Self Care | Payer: Medicare Other | Source: Ambulatory Visit | Attending: Family Medicine | Admitting: Family Medicine

## 2022-10-14 DIAGNOSIS — I6521 Occlusion and stenosis of right carotid artery: Secondary | ICD-10-CM | POA: Diagnosis not present

## 2022-10-14 DIAGNOSIS — I6523 Occlusion and stenosis of bilateral carotid arteries: Secondary | ICD-10-CM | POA: Diagnosis not present

## 2022-10-16 ENCOUNTER — Ambulatory Visit (HOSPITAL_BASED_OUTPATIENT_CLINIC_OR_DEPARTMENT_OTHER): Payer: Medicare Other

## 2022-11-01 ENCOUNTER — Other Ambulatory Visit: Payer: Self-pay | Admitting: Family Medicine

## 2022-11-01 ENCOUNTER — Encounter: Payer: Self-pay | Admitting: Family Medicine

## 2022-11-01 MED ORDER — FLUTICASONE PROPIONATE 50 MCG/ACT NA SUSP: 2.0000 | Freq: Every day | NASAL | 3 refills | Status: DC

## 2022-11-20 DIAGNOSIS — G4733 Obstructive sleep apnea (adult) (pediatric): Secondary | ICD-10-CM | POA: Diagnosis not present

## 2022-12-18 ENCOUNTER — Encounter: Payer: Self-pay | Admitting: Family Medicine

## 2023-01-03 ENCOUNTER — Ambulatory Visit: Payer: Medicare HMO | Admitting: *Deleted

## 2023-01-03 DIAGNOSIS — Z0000 Encounter for general adult medical examination without abnormal findings: Secondary | ICD-10-CM | POA: Diagnosis not present

## 2023-01-03 NOTE — Patient Instructions (Signed)
Terry Myers , Thank you for taking time to come for your Medicare Wellness Visit. I appreciate your ongoing commitment to your health goals. Please review the following plan we discussed and let me know if I can assist you in the future.   These are the goals we discussed:  Goals      Patient Stated     Maintain current health        This is a list of the screening recommended for you and due dates:  Health Maintenance  Topic Date Due   DTaP/Tdap/Td vaccine (2 - Td or Tdap) 12/24/2022   COVID-19 Vaccine (7 - 2023-24 season) 06/18/2023*   Medicare Annual Wellness Visit  01/04/2024   Pneumonia Vaccine  Completed   Flu Shot  Completed   Hepatitis C Screening: USPSTF Recommendation to screen - Ages 18-79 yo.  Completed   Zoster (Shingles) Vaccine  Completed   HPV Vaccine  Aged Out   Colon Cancer Screening  Discontinued  *Topic was postponed. The date shown is not the original due date.     Next appointment: Follow up in one year for your annual wellness visit.   Preventive Care 36 Years and Older, Male Preventive care refers to lifestyle choices and visits with your health care provider that can promote health and wellness. What does preventive care include? A yearly physical exam. This is also called an annual well check. Dental exams once or twice a year. Routine eye exams. Ask your health care provider how often you should have your eyes checked. Personal lifestyle choices, including: Daily care of your teeth and gums. Regular physical activity. Eating a healthy diet. Avoiding tobacco and drug use. Limiting alcohol use. Practicing safe sex. Taking low doses of aspirin every day. Taking vitamin and mineral supplements as recommended by your health care provider. What happens during an annual well check? The services and screenings done by your health care provider during your annual well check will depend on your age, overall health, lifestyle risk factors, and family  history of disease. Counseling  Your health care provider may ask you questions about your: Alcohol use. Tobacco use. Drug use. Emotional well-being. Home and relationship well-being. Sexual activity. Eating habits. History of falls. Memory and ability to understand (cognition). Work and work Statistician. Screening  You may have the following tests or measurements: Height, weight, and BMI. Blood pressure. Lipid and cholesterol levels. These may be checked every 5 years, or more frequently if you are over 34 years old. Skin check. Lung cancer screening. You may have this screening every year starting at age 19 if you have a 30-pack-year history of smoking and currently smoke or have quit within the past 15 years. Fecal occult blood test (FOBT) of the stool. You may have this test every year starting at age 80. Flexible sigmoidoscopy or colonoscopy. You may have a sigmoidoscopy every 5 years or a colonoscopy every 10 years starting at age 70. Prostate cancer screening. Recommendations will vary depending on your family history and other risks. Hepatitis C blood test. Hepatitis B blood test. Sexually transmitted disease (STD) testing. Diabetes screening. This is done by checking your blood sugar (glucose) after you have not eaten for a while (fasting). You may have this done every 1-3 years. Abdominal aortic aneurysm (AAA) screening. You may need this if you are a current or former smoker. Osteoporosis. You may be screened starting at age 83 if you are at high risk. Talk with your health care provider about  your test results, treatment options, and if necessary, the need for more tests. Vaccines  Your health care provider may recommend certain vaccines, such as: Influenza vaccine. This is recommended every year. Tetanus, diphtheria, and acellular pertussis (Tdap, Td) vaccine. You may need a Td booster every 10 years. Zoster vaccine. You may need this after age 45. Pneumococcal  13-valent conjugate (PCV13) vaccine. One dose is recommended after age 10. Pneumococcal polysaccharide (PPSV23) vaccine. One dose is recommended after age 8. Talk to your health care provider about which screenings and vaccines you need and how often you need them. This information is not intended to replace advice given to you by your health care provider. Make sure you discuss any questions you have with your health care provider. Document Released: 11/26/2015 Document Revised: 07/19/2016 Document Reviewed: 08/31/2015 Elsevier Interactive Patient Education  2017 [place] Prevention in the Home Falls can cause injuries. They can happen to people of all ages. There are many things you can do to make your home safe and to help prevent falls. What can I do on the outside of my home? Regularly fix the edges of walkways and driveways and fix any cracks. Remove anything that might make you trip as you walk through a door, such as a raised step or threshold. Trim any bushes or trees on the path to your home. Use bright outdoor lighting. Clear any walking paths of anything that might make someone trip, such as rocks or tools. Regularly check to see if handrails are loose or broken. Make sure that both sides of any steps have handrails. Any raised decks and porches should have guardrails on the edges. Have any leaves, snow, or ice cleared regularly. Use sand or salt on walking paths during winter. Clean up any spills in your garage right away. This includes oil or grease spills. What can I do in the bathroom? Use night lights. Install grab bars by the toilet and in the tub and shower. Do not use towel bars as grab bars. Use non-skid mats or decals in the tub or shower. If you need to sit down in the shower, use a plastic, non-slip stool. Keep the floor dry. Clean up any water that spills on the floor as soon as it happens. Remove soap buildup in the tub or shower regularly. Attach  bath mats securely with double-sided non-slip rug tape. Do not have throw rugs and other things on the floor that can make you trip. What can I do in the bedroom? Use night lights. Make sure that you have a light by your bed that is easy to reach. Do not use any sheets or blankets that are too big for your bed. They should not hang down onto the floor. Have a firm chair that has side arms. You can use this for support while you get dressed. Do not have throw rugs and other things on the floor that can make you trip. What can I do in the kitchen? Clean up any spills right away. Avoid walking on wet floors. Keep items that you use a lot in easy-to-reach places. If you need to reach something above you, use a strong step stool that has a grab bar. Keep electrical cords out of the way. Do not use floor polish or wax that makes floors slippery. If you must use wax, use non-skid floor wax. Do not have throw rugs and other things on the floor that can make you trip. What can I do with  my stairs? Do not leave any items on the stairs. Make sure that there are handrails on both sides of the stairs and use them. Fix handrails that are broken or loose. Make sure that handrails are as long as the stairways. Check any carpeting to make sure that it is firmly attached to the stairs. Fix any carpet that is loose or worn. Avoid having throw rugs at the top or bottom of the stairs. If you do have throw rugs, attach them to the floor with carpet tape. Make sure that you have a light switch at the top of the stairs and the bottom of the stairs. If you do not have them, ask someone to add them for you. What else can I do to help prevent falls? Wear shoes that: Do not have high heels. Have rubber bottoms. Are comfortable and fit you well. Are closed at the toe. Do not wear sandals. If you use a stepladder: Make sure that it is fully opened. Do not climb a closed stepladder. Make sure that both sides of the  stepladder are locked into place. Ask someone to hold it for you, if possible. Clearly mark and make sure that you can see: Any grab bars or handrails. First and last steps. Where the edge of each step is. Use tools that help you move around (mobility aids) if they are needed. These include: Canes. Walkers. Scooters. Crutches. Turn on the lights when you go into a dark area. Replace any light bulbs as soon as they burn out. Set up your furniture so you have a clear path. Avoid moving your furniture around. If any of your floors are uneven, fix them. If there are any pets around you, be aware of where they are. Review your medicines with your doctor. Some medicines can make you feel dizzy. This can increase your chance of falling. Ask your doctor what other things that you can do to help prevent falls. This information is not intended to replace advice given to you by your health care provider. Make sure you discuss any questions you have with your health care provider. Document Released: 08/26/2009 Document Revised: 04/06/2016 Document Reviewed: 12/04/2014 Elsevier Interactive Patient Education  2017 [employer].

## 2023-01-03 NOTE — Progress Notes (Signed)
Subjective:   Bruce Windmiller is a 77 y.o. male who presents for Medicare Annual/Subsequent preventive examination.  I connected with  Nat Christen on 01/03/23 by a audio enabled telemedicine application and verified that I am speaking with the correct person using two identifiers.  Patient Location: Home  Provider Location: Office/Clinic  I discussed the limitations of evaluation and management by telemedicine. The patient expressed understanding and agreed to proceed.   Review of Systems    Defer to PCP Cardiac Risk Factors include: advanced age (>92mn, >>34women);male gender;hypertension;dyslipidemia     Objective:    There were no vitals filed for this visit. There is no height or weight on file to calculate BMI.     01/03/2023   11:02 AM 12/27/2021   11:48 AM 02/24/2014    6:58 PM  Advanced Directives  Does Patient Have a Medical Advance Directive? No No Patient does not have advance directive;Patient would not like information  Would patient like information on creating a medical advance directive? No - Patient declined Yes (MAU/Ambulatory/Procedural Areas - Information given)   Pre-existing out of facility DNR order (yellow form or pink MOST form)   No    Current Medications (verified) Outpatient Encounter Medications as of 01/03/2023  Medication Sig   alfuzosin (UROXATRAL) 10 MG 24 hr tablet Take 1 tablet (10 mg total) by mouth daily with breakfast.   Ascorbic Acid (VITAMIN C) 1000 MG tablet Take 1,000 mg by mouth daily.   cholecalciferol (VITAMIN D3) 25 MCG (1000 UNIT) tablet Take 1,000 Units by mouth daily.   ferrous sulfate 325 (65 FE) MG tablet    fluticasone (FLONASE) 50 MCG/ACT nasal spray Place 2 sprays into both nostrils daily.   losartan (COZAAR) 100 MG tablet Take 1 tablet (100 mg total) by mouth daily.   rivaroxaban (XARELTO) 10 MG TABS tablet Take 10 mg by mouth daily.   rosuvastatin (CRESTOR) 10 MG tablet Take 1 tablet (10 mg total) by mouth daily.    vitamin B-12 (CYANOCOBALAMIN) 1000 MCG tablet Take 1,000 mcg by mouth daily.   [DISCONTINUED] gabapentin (NEURONTIN) 100 MG capsule Take 1 capsule (100 mg total) by mouth 3 (three) times daily.   No facility-administered encounter medications on file as of 01/03/2023.    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   [place]   Sleep apnea    "wore mask a long time ago"   Past Surgical History:  Procedure Laterality Date   BACK SURGERY     POSTERIOR CERVICAL FUSION/FORAMINOTOMY  2005   C3-C7   PROSTATE BIOPSY  06/2011; 01/2014   TONSILLECTOMY  1950's   Family History  Problem Relation Age of Onset   Cancer Mother    Heart attack Brother    Social History   Socioeconomic History   Marital status: Married    Spouse name: Not on file   Number of children: Not on file   Years of education: Not on file   Highest education level: Not on file  Occupational History   Not on file  Tobacco Use   Smoking status: Former    Packs/day: 2.00    Years: 10.00    Total pack years: 20.00  Types: Cigarettes   Smokeless tobacco: Never   Tobacco comments:    02/24/2014 "quit smoking in the 1970's"  Substance and Sexual Activity   Alcohol use: Yes    Alcohol/week: 14.0 standard drinks of alcohol    Types: 14 Glasses of wine per week    Comment: 02/24/2014 "at least 2 glasses of wine every day"   Drug use: No   Sexual activity: Yes  Other Topics Concern   Not on file  Social History Narrative   Not on file   Social Determinants of Health   Financial Resource Strain: Low Risk  (12/27/2021)   Overall Financial Resource Strain (CARDIA)    Difficulty of Paying Living Expenses: Not hard at all  Food Insecurity: No Food  Insecurity (01/03/2023)   Hunger Vital Sign    Worried About Running Out of Food in the Last Year: Never true    Ran Out of Food in the Last Year: Never true  Transportation Needs: No Transportation Needs (01/03/2023)   PRAPARE - Hydrologist (Medical): No    Lack of Transportation (Non-Medical): No  Physical Activity: Inactive (12/27/2021)   Exercise Vital Sign    Days of Exercise per Week: 0 days    Minutes of Exercise per Session: 0 min  Stress: No Stress Concern Present (12/27/2021)   [place]    Feeling of Stress : Not at all  Social Connections: Socially Isolated (12/27/2021)   Social Connection and Isolation Panel [NHANES]    Frequency of Communication with Friends and Family: Once a week    Frequency of Social Gatherings with Friends and Family: Once a week    Attends Religious Services: Never    Marine scientist or Organizations: No    Attends Archivist Meetings: Never    Marital Status: Married    Tobacco Counseling Counseling given: Not Answered Tobacco comments: 02/24/2014 "quit smoking in the 1970's"   Clinical Intake:  Pre-visit preparation completed: Yes  Pain : No/denies pain  Diabetes: No  How often do you need to have someone help you when you read instructions, pamphlets, or other written materials from your doctor or pharmacy?: 1 - Never   Activities of Daily Living    01/03/2023   11:04 AM  In your present state of health, do you have any difficulty performing the following activities:  Hearing? 0  Vision? 0  Difficulty concentrating or making decisions? 0  Walking or climbing stairs? 0  Dressing or bathing? 0  Doing errands, shopping? 0  Preparing Food and eating ? N  Using the Toilet? N  In the past six months, have you accidently leaked urine? Y  Do you have problems with loss of bowel control? N  Managing your Medications? N   Managing your Finances? N  Housekeeping or managing your Housekeeping? N    Patient Care Team: Shelda Pal, DO as PCP - General (Family Medicine) Vista Lawman Youlanda Roys, MD as Attending Physician (Family Medicine)  Indicate any recent Medical Services you may have received from other than [facility] providers in the past year (date may be approximate).     Assessment:   This is a routine wellness examination for [place].  Hearing/Vision screen No results found.  Dietary issues and exercise activities discussed: Current Exercise Habits: Home exercise routine, Type of exercise: walking, Time (Minutes): 60, Frequency (Times/Week): 2, Weekly Exercise (Minutes/Week): 120, Intensity: Mild, Exercise limited by: None  identified   Goals Addressed   None    Depression Screen    01/03/2023   11:03 AM 07/24/2022    1:33 PM 12/27/2021   11:55 AM 07/13/2021    2:41 PM 04/12/2021    3:54 PM  PHQ 2/9 Scores  PHQ - 2 Score 2 2 0 0 0  PHQ- 9 Score  5       Fall Risk    01/03/2023   11:02 AM 07/24/2022    1:32 PM 12/27/2021   11:53 AM 07/13/2021    2:40 PM 04/12/2021    3:53 PM  Fall Risk   Falls in the past year? 0 0 0 0 0  Number falls in past yr: 0 0 0 0 0  Injury with Fall? 0 0 0 0 0  Risk for fall due to : No Fall Risks No Fall Risks  No Fall Risks No Fall Risks  Follow up Falls evaluation completed Falls evaluation completed Falls prevention discussed Falls evaluation completed Falls evaluation completed    [place]:  Any stairs in or around the home? Yes  If so, are there any without handrails? No  Home free of loose throw rugs in walkways, pet beds, electrical cords, etc? Yes  Adequate lighting in your home to reduce risk of falls? Yes   ASSISTIVE DEVICES UTILIZED TO PREVENT FALLS:  Life alert? No  Use of a cane, walker or w/c? No  Grab bars in the bathroom? Yes  Shower chair or bench in shower? No  Elevated toilet seat or a handicapped  toilet? No   TIMED UP AND GO:  Was the test performed?  No, audio visit .   Cognitive Function:        01/03/2023   11:07 AM  6CIT Screen  What Year? 0 points  What month? 0 points  What time? 0 points  Count back from 20 0 points  Months in reverse 0 points  Repeat phrase 2 points  Total Score 2 points    Immunizations Immunization History  Administered Date(s) Administered   Influenza Split 09/13/2016, 08/18/2020   Influenza, High Dose Seasonal PF 10/14/2017, 07/10/2019, 09/08/2021, 08/11/2022   PFIZER Comirnaty(Gray Top)Covid-19 Tri-Sucrose Vaccine 12/02/2019, 12/23/2019, 09/02/2020, 05/30/2021   PFIZER(Purple Top)SARS-COV-2 Vaccination 08/11/2022   Pfizer Covid-19 Vaccine Bivalent Booster 30yr & up 09/08/2021   Pneumococcal Conjugate-13 01/11/2016   Pneumococcal Polysaccharide-23 04/09/2018   Tdap 12/24/2012   Zoster Recombinat (Shingrix) 04/20/2022, 09/20/2022    TDAP status: Due, Education has been provided regarding the importance of this vaccine. Advised may receive this vaccine at local pharmacy or Health Dept. Aware to provide a copy of the vaccination record if obtained from local pharmacy or Health Dept. Verbalized acceptance and understanding.  Flu Vaccine status: Up to date  Pneumococcal vaccine status: Up to date  Covid-19 vaccine status: Information provided on how to obtain vaccines.   Qualifies for Shingles Vaccine? Yes   Zostavax completed No   Shingrix Completed?: Yes  Screening Tests Health Maintenance  Topic Date Due   DTaP/Tdap/Td (2 - Td or Tdap) 12/24/2022   Medicare Annual Wellness (AWV)  12/27/2022   COVID-19 Vaccine (7 - 2023-24 season) 06/18/2023 (Originally 10/06/2022)   Pneumonia Vaccine 77 Years old  Completed   INFLUENZA VACCINE  Completed   Hepatitis C Screening  Completed   Zoster Vaccines- Shingrix  Completed   HPV VACCINES  Aged Out   COLONOSCOPY (Pts [phone] coverage will need to be  confirmed)  Discontinued     Health Maintenance  Health Maintenance Due  Topic Date Due   DTaP/Tdap/Td (2 - Td or Tdap) 12/24/2022   Medicare Annual Wellness (AWV)  12/27/2022    Colorectal cancer screening: Type of screening: Colonoscopy. Completed 07/13/17. Repeat every N/a years  Lung Cancer Screening: (Low Dose CT Chest recommended if Age 77-80 years, 30 pack-year currently smoking OR have quit w/in 15years.) does not qualify.   Additional Screening:  Hepatitis C Screening: does qualify; Completed 07/13/21  Vision Screening: Recommended annual ophthalmology exams for early detection of glaucoma and other disorders of the eye. Is the patient up to date with their annual eye exam?  Yes  Who is the provider or what is the name of the office in which the patient attends annual eye exams? Dr. Verl Blalock If pt is not established with a provider, would they like to be referred to a provider to establish care? No .   Dental Screening: Recommended annual dental exams for proper oral hygiene  Community Resource Referral / Chronic Care Management: CRR required this visit?  No   CCM required this visit?  No      Plan:     I have personally reviewed and noted the following in the patient's chart:   Medical and social history Use of alcohol, tobacco or illicit drugs  Current medications and supplements including opioid prescriptions. Patient is not currently taking opioid prescriptions. Functional ability and status Nutritional status Physical activity Advanced directives List of other physicians Hospitalizations, surgeries, and ER visits in previous 12 months Vitals Screenings to include cognitive, depression, and falls Referrals and appointments  In addition, I have reviewed and discussed with patient certain preventive protocols, quality metrics, and best practice recommendations. A written personalized care plan for preventive services as well as general preventive health recommendations were provided  to patient.   Due to this being a telephonic visit, the after visit summary with patients personalized plan was offered to patient via mail or my-chart. Patient would like to access on my-chart.  Beatris Ship, [place]   01/03/2023   Nurse Notes: None

## 2023-01-08 DIAGNOSIS — Z7901 Long term (current) use of anticoagulants: Secondary | ICD-10-CM | POA: Diagnosis not present

## 2023-01-08 DIAGNOSIS — I8290 Acute embolism and thrombosis of unspecified vein: Secondary | ICD-10-CM | POA: Diagnosis not present

## 2023-01-08 DIAGNOSIS — I825Y1 Chronic embolism and thrombosis of unspecified deep veins of right proximal lower extremity: Secondary | ICD-10-CM | POA: Diagnosis not present

## 2023-01-08 DIAGNOSIS — D6851 Activated protein C resistance: Secondary | ICD-10-CM | POA: Diagnosis not present

## 2023-01-08 DIAGNOSIS — I82552 Chronic embolism and thrombosis of left peroneal vein: Secondary | ICD-10-CM | POA: Diagnosis not present

## 2023-01-08 DIAGNOSIS — D6859 Other primary thrombophilia: Secondary | ICD-10-CM | POA: Diagnosis not present

## 2023-01-13 IMAGING — MR MR LUMBAR SPINE W/O CM
5 series · 31 of 48 positions shown · non-contrast
Comparison: None.

CLINICAL DATA: Low back pain with left radiculopathy.

EXAM:
MRI LUMBAR SPINE WITHOUT CONTRAST
TECHNIQUE: Multiplanar, multisequence MR imaging of the lumbar spine was
performed. No intravenous contrast was administered.

[Series 3: T2 · sagittal · 4.0mm · 0.51mm/px · 6 of 16 slices shown (1 of 2)]
[im 1/16]
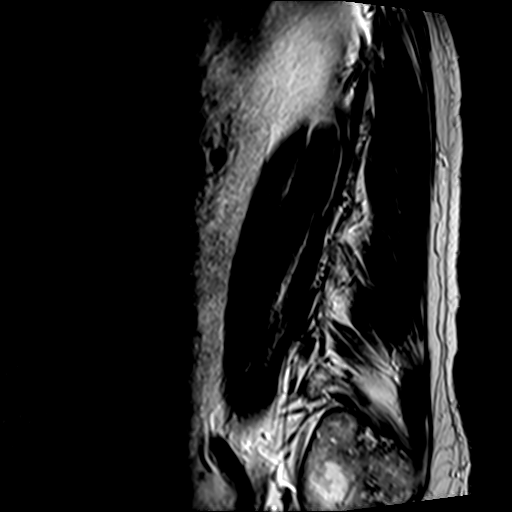
[im 4/16]
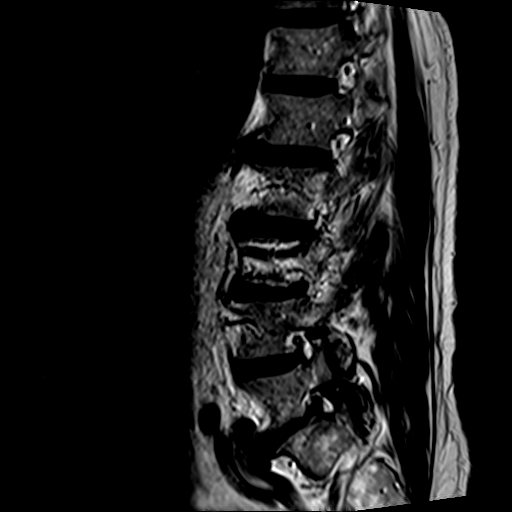
[im 7/16]
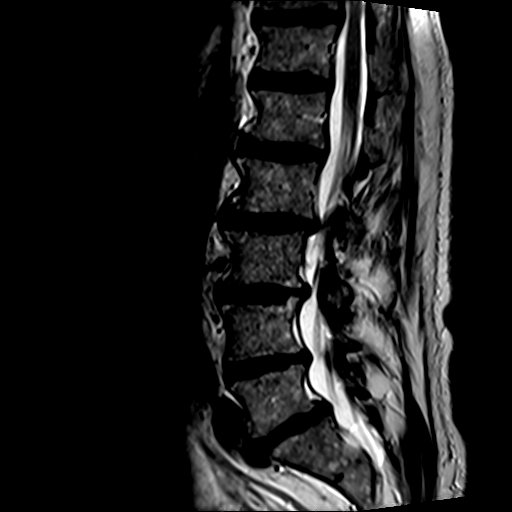
[im 10/16]
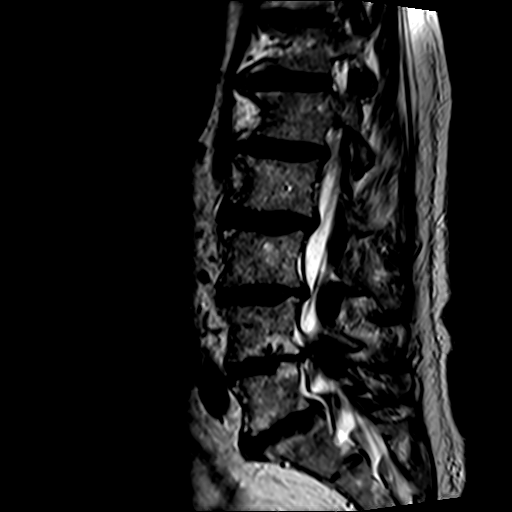
[im 13/16]
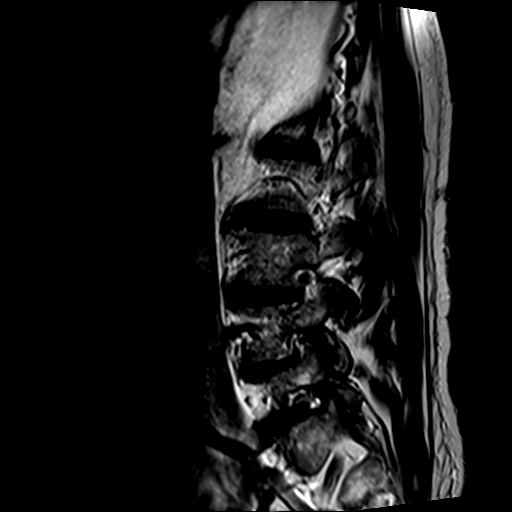
[im 16/16]
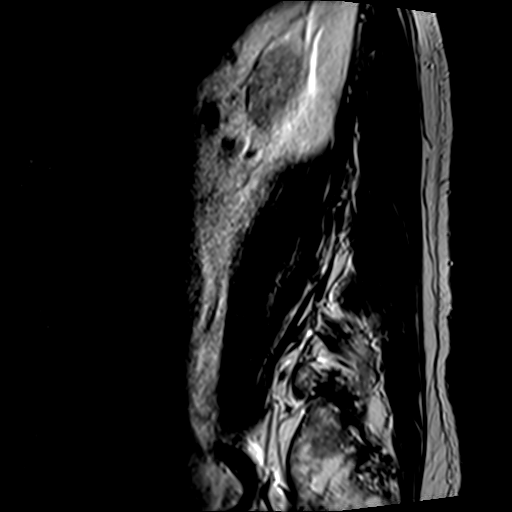

[Series 4: t2_tirm_sag_p2 · sagittal · 4.0mm · 0.51mm/px · 1 of 16 slices shown]
[im 1/16]
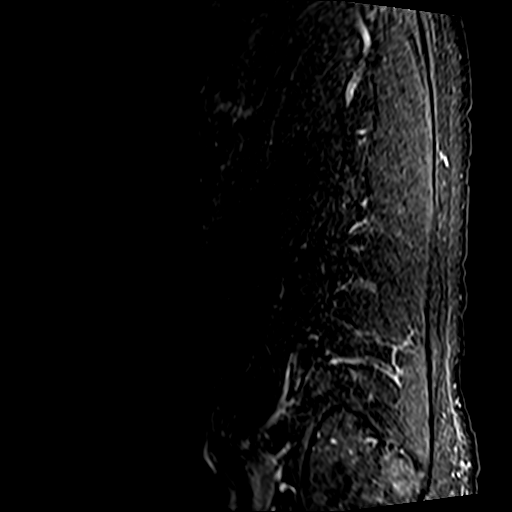

[Series 5: T1 · sagittal · 4.0mm · 0.51mm/px · 6 of 16 slices shown (1 of 2)]
[im 1/16]
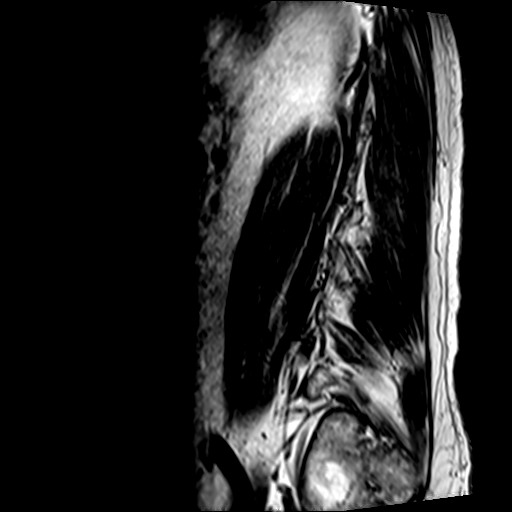
[im 4/16]
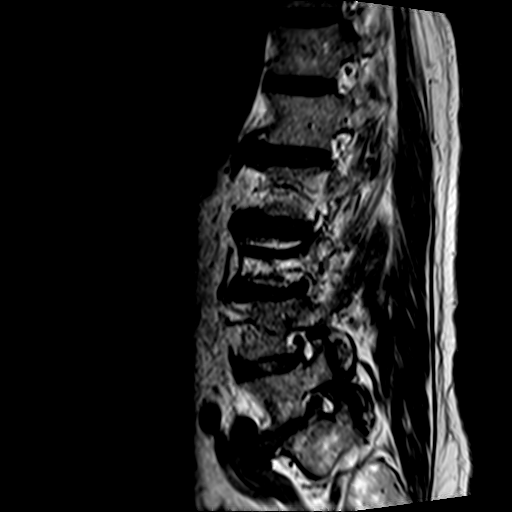
[im 7/16]
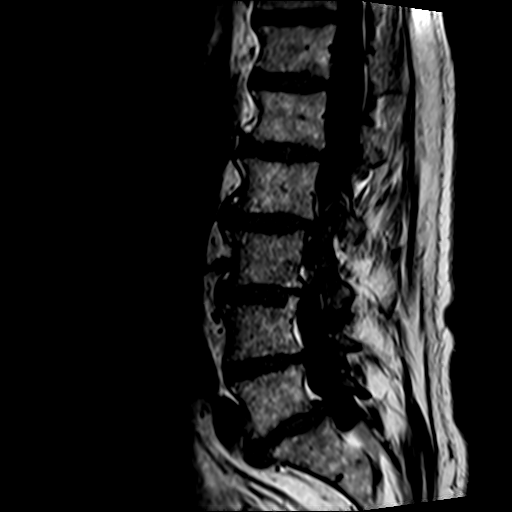
[im 10/16]
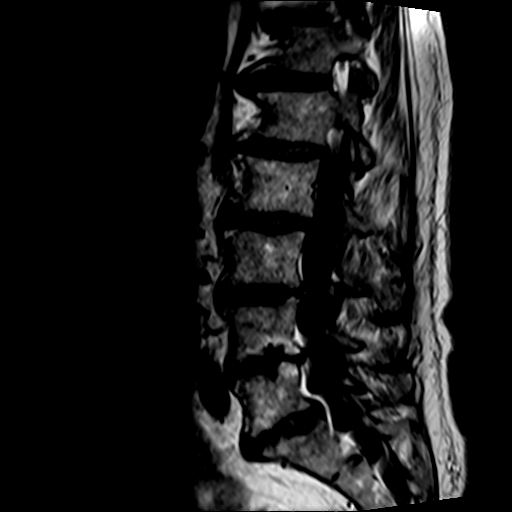
[im 13/16]
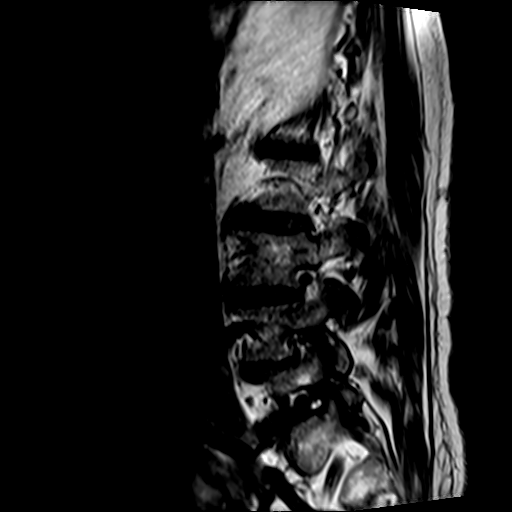
[im 16/16]
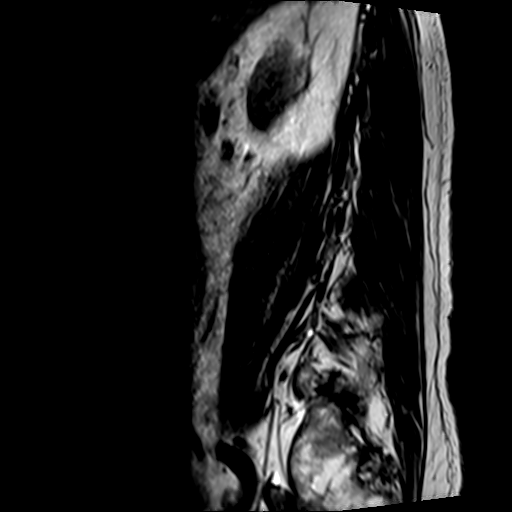

[Series 6: T2 · axial · 4.0mm · 0.78mm/px · z∈[-111,+116]mm · 9 of 41 slices shown (2 of 2)]
[im 1/41]
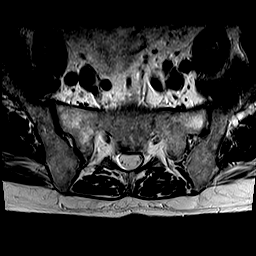
[im 6/41]
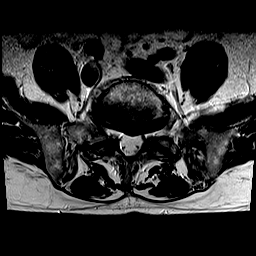
[im 12/41]
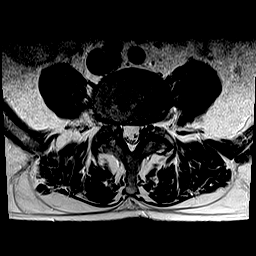
[im 18/41]
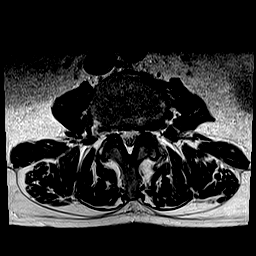
[im 21/41]
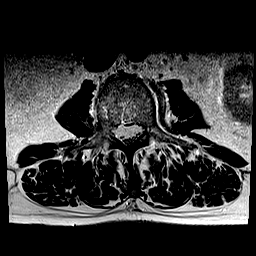
[im 23/41]
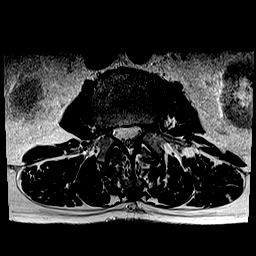
[im 29/41]
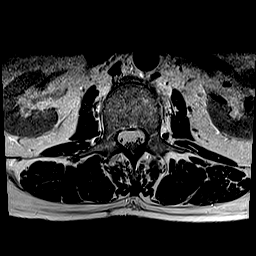
[im 35/41]
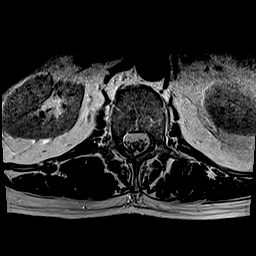
[im 41/41]
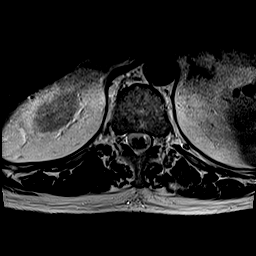

[Series 7: T1 · axial · 4.0mm · 0.78mm/px · z∈[-111,+116]mm · 9 of 41 slices shown (2 of 2)]
[im 1/41]
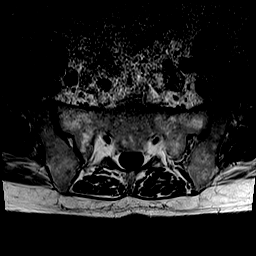
[im 6/41]
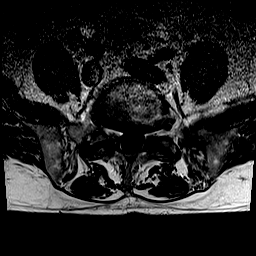
[im 12/41]
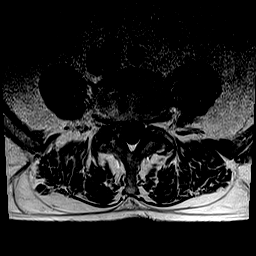
[im 18/41]
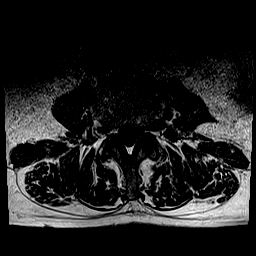
[im 21/41]
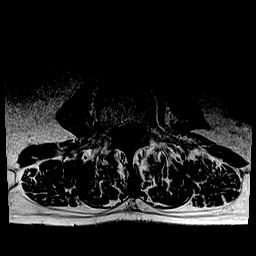
[im 23/41]
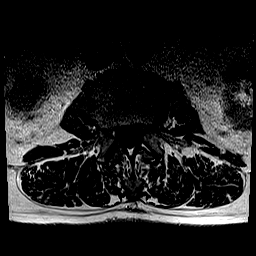
[im 29/41]
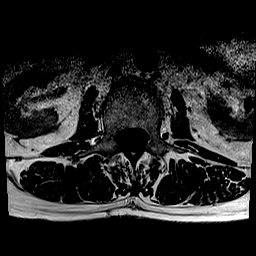
[im 35/41]
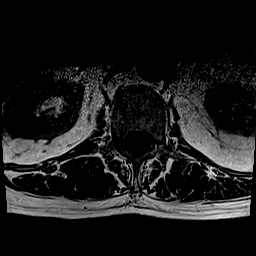
[im 41/41]
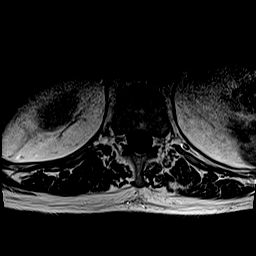

[31 of 48 positions shown; findings below may reference images not displayed]

FINDINGS: Segmentation:  Standard.

Alignment: Trace retrolisthesis of L2 over L3 and trace
anterolisthesis of L5 over S1.

Vertebrae:  No fracture, evidence of discitis, or bone lesion.

Conus medullaris and cauda equina: Conus extends to the L1 level.
Conus and cauda equina appear normal.

Paraspinal and other soft tissues: Negative.

Disc levels:

T12-L1: Shallow disc bulge and mild facet degenerative changes
without significant spinal canal or neural foraminal stenosis.

L1-2: Shallow disc bulge slightly asymmetric to the left and mild
facet degenerative changes resulting in minimal left neural
foraminal narrowing. No significant spinal canal stenosis.

L2-3: Disc bulge, mildly asymmetric to the left and mild facet
degenerative changes resulting in mild narrowing of the left
subarticular zone and mild left neural foraminal narrowing.

L3-4: Loss of disc height, disc bulge, moderate facet degenerative
changes and ligamentum flavum redundancy resulting in mild spinal
canal stenosis with narrowing of the bilateral subarticular zones
and mild bilateral neural foraminal narrowing.

L4-5: Loss of disc height, disc bulge, moderate facet degenerative
changes and ligamentum flavum redundancy, right greater than left,
resulting in narrowing of the bilateral subarticular zones, right
greater than left, moderate right and mild left neural foraminal
narrowing. No significant spinal canal stenosis.

L5-S1: Disc bulge with superimposed small central disc protrusion
and left foraminal disc protrusion. Moderate facet degenerative
changes. Findings result in narrowing of the bilateral subarticular
zones, moderate to severe right and severe left neural foraminal
narrowing.
IMPRESSION: 1. Degenerative changes of the lumbar spine, more pronounced at
L5-S1 where there is narrowing of the bilateral subarticular zones
with moderate to severe right and severe left neural foraminal
narrowing.
2. Mild spinal canal stenosis at L3-4.
3. Narrowing of the bilateral subarticular zones, moderate right and
mild left neural foraminal narrowing at L4-5.

## 2023-02-19 DIAGNOSIS — G4733 Obstructive sleep apnea (adult) (pediatric): Secondary | ICD-10-CM | POA: Diagnosis not present

## 2023-03-21 DIAGNOSIS — G4733 Obstructive sleep apnea (adult) (pediatric): Secondary | ICD-10-CM | POA: Diagnosis not present

## 2023-03-27 DIAGNOSIS — H5203 Hypermetropia, bilateral: Secondary | ICD-10-CM | POA: Diagnosis not present

## 2023-03-27 DIAGNOSIS — H02834 Dermatochalasis of left upper eyelid: Secondary | ICD-10-CM | POA: Diagnosis not present

## 2023-03-27 DIAGNOSIS — H02831 Dermatochalasis of right upper eyelid: Secondary | ICD-10-CM | POA: Diagnosis not present

## 2023-03-27 DIAGNOSIS — H25043 Posterior subcapsular polar age-related cataract, bilateral: Secondary | ICD-10-CM | POA: Diagnosis not present

## 2023-03-27 DIAGNOSIS — H52203 Unspecified astigmatism, bilateral: Secondary | ICD-10-CM | POA: Diagnosis not present

## 2023-03-27 DIAGNOSIS — H11153 Pinguecula, bilateral: Secondary | ICD-10-CM | POA: Diagnosis not present

## 2023-03-27 DIAGNOSIS — H0100B Unspecified blepharitis left eye, upper and lower eyelids: Secondary | ICD-10-CM | POA: Diagnosis not present

## 2023-03-27 DIAGNOSIS — H268 Other specified cataract: Secondary | ICD-10-CM | POA: Diagnosis not present

## 2023-03-27 DIAGNOSIS — H0100A Unspecified blepharitis right eye, upper and lower eyelids: Secondary | ICD-10-CM | POA: Diagnosis not present

## 2023-03-27 DIAGNOSIS — H35033 Hypertensive retinopathy, bilateral: Secondary | ICD-10-CM | POA: Diagnosis not present

## 2023-03-27 DIAGNOSIS — H2513 Age-related nuclear cataract, bilateral: Secondary | ICD-10-CM | POA: Diagnosis not present

## 2023-03-27 DIAGNOSIS — H43813 Vitreous degeneration, bilateral: Secondary | ICD-10-CM | POA: Diagnosis not present

## 2023-04-21 DIAGNOSIS — G4733 Obstructive sleep apnea (adult) (pediatric): Secondary | ICD-10-CM | POA: Diagnosis not present

## 2023-05-21 DIAGNOSIS — G4733 Obstructive sleep apnea (adult) (pediatric): Secondary | ICD-10-CM | POA: Diagnosis not present

## 2023-06-06 ENCOUNTER — Telehealth: Payer: Self-pay

## 2023-06-06 NOTE — Telephone Encounter (Signed)
Reached out to patient to set up follow up visit/CPE with provider to discuss chronic conditions.  Telephone encounter attempt : 1   A HIPAA compliant voice message was left requesting a return call.  Instructed patient to call office or to call me at [phone].  Elijio Miles [facility]

## 2023-06-07 DIAGNOSIS — J019 Acute sinusitis, unspecified: Secondary | ICD-10-CM | POA: Diagnosis not present

## 2023-06-13 ENCOUNTER — Encounter (INDEPENDENT_AMBULATORY_CARE_PROVIDER_SITE_OTHER): Payer: Self-pay

## 2023-06-21 DIAGNOSIS — Z9989 Dependence on other enabling machines and devices: Secondary | ICD-10-CM | POA: Diagnosis not present

## 2023-06-21 DIAGNOSIS — G4733 Obstructive sleep apnea (adult) (pediatric): Secondary | ICD-10-CM | POA: Diagnosis not present

## 2023-07-05 ENCOUNTER — Ambulatory Visit (INDEPENDENT_AMBULATORY_CARE_PROVIDER_SITE_OTHER): Payer: Medicare HMO | Admitting: Family Medicine

## 2023-07-05 ENCOUNTER — Encounter: Payer: Self-pay | Admitting: Family Medicine

## 2023-07-05 VITALS — BP 112/72 | HR 51 | Temp 98.4°F | Ht 72.0 in | Wt 208.1 lb

## 2023-07-05 DIAGNOSIS — Z0000 Encounter for general adult medical examination without abnormal findings: Secondary | ICD-10-CM | POA: Diagnosis not present

## 2023-07-05 DIAGNOSIS — I10 Essential (primary) hypertension: Secondary | ICD-10-CM

## 2023-07-05 NOTE — Patient Instructions (Addendum)
Give [place] 2-3 business days to get the results of your labs back.   Keep the diet clean and stay active.  Please get me a copy of your advanced directive form at your convenience.   Please get your tetanus booster at the pharmacy.   I recommend getting the flu shot in mid October. This suggestion would change if the CDC comes out with a different recommendation.   Let us know if you need anything.

## 2023-07-05 NOTE — Progress Notes (Signed)
Chief Complaint  Patient presents with   Annual Exam    Well Male Terry Myers is here for a complete physical.   His last physical was >1 year ago.  Current diet: in general, a "healthy" diet.   Current exercise: limited Weight trend: stable Fatigue out of ordinary? No. Seat belt? Yes.   Advanced directive? 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 90 tablet 1   Ascorbic Acid (VITAMIN C) 1000 MG tablet Take 1,000 mg by mouth daily.     cholecalciferol (VITAMIN D3) 25 MCG (1000 UNIT) tablet Take 1,000 Units by mouth daily.     ferrous sulfate 325 (65 FE) MG tablet      fluticasone (FLONASE) 50 MCG/ACT nasal spray Place 2 sprays into both nostrils daily. 48 g 3   losartan (COZAAR) 100 MG tablet Take 1 tablet (100 mg total) by mouth daily. 90 tablet 1   rivaroxaban (XARELTO) 10 MG TABS tablet Take 10 mg by mouth daily.     rosuvastatin (CRESTOR) 10 MG tablet Take 1 tablet (10 mg total) by mouth daily. 90 tablet 3   vitamin B-12 (CYANOCOBALAMIN) 1000 MCG tablet Take 1,000 mcg by mouth daily.     Allergies Allergies  Allergen Reactions   Hydrochlorothiazide Rash   Tamsulosin Other (See Comments) and Palpitations    Lower back and side pain;  agitation Lower back and side pain; agitation     Family History Family History  Problem Relation Age of Onset   Cancer Mother    Heart attack Brother     Review of Systems: Constitutional:  no fevers Eye:  no recent significant change in vision Ears:  No changes in hearing Nose/Mouth/Throat:  no complaints of nasal congestion, no sore throat Cardiovascular: no chest pain Respiratory:  No shortness of breath Gastrointestinal:  No change in bowel habits GU:  No frequency Integumentary:  no abnormal skin lesions reported Neurologic:  no headaches Endocrine:  denies unexplained weight changes  Exam BP 112/72 (BP Location: Left Arm, Patient Position: Sitting, Cuff Size: Normal)   Pulse (!) 51   Temp 98.4 F (36.9 C) (Oral)   Ht 6' (1.829 m)   Wt 208 lb 2 oz (94.4 kg)   SpO2 98%   BMI 28.23 kg/m  General:  well developed, well nourished, in no apparent distress Skin:  no significant moles, warts, or growths Head:  no masses, lesions, or tenderness Eyes:  pupils equal and round, sclera anicteric without injection Ears:  canals without lesions, TMs shiny without retraction, no obvious effusion, no erythema Nose:  nares patent, mucosa normal Throat/Pharynx:  lips and gingiva without  lesion; tongue and uvula midline; non-inflamed pharynx; no exudates or postnasal drainage Lungs:  clear to auscultation, breath sounds equal bilaterally, no respiratory distress Cardio:  regular rhythm, bradycardic, no LE edema or bruits Rectal: Deferred GI: BS+, S, NT, ND, no masses or organomegaly Musculoskeletal:  symmetrical muscle groups noted without atrophy or deformity Neuro:  gait normal; deep tendon reflexes normal and symmetric Psych: well oriented with normal range of affect and appropriate judgment/insight  Assessment and Plan  Well adult exam  Essential hypertension - Plan: Comprehensive metabolic panel, CBC, Lipid panel, IBC + Ferritin, B12   Well 77 y.o. male. Counseled on  diet and exercise. Advanced directive form requested today.  Rec'd he get his tetanus booster at the pharmacy.  Other orders as above. Follow up in 1 yr.  The patient voiced understanding and agreement to the plan.  Jilda Roche [place], DO 07/05/23 1:21 PM

## 2023-07-06 LAB — COMPREHENSIVE METABOLIC PANEL
ALT: 16 U/L (ref 0–53)
AST: 16 U/L (ref 0–37)
Albumin: 4.2 g/dL (ref 3.5–5.2)
Alkaline Phosphatase: 44 U/L (ref 39–117)
BUN: 10 mg/dL (ref 6–23)
CO2: 29 meq/L (ref 19–32)
Calcium: 9.4 mg/dL (ref 8.4–10.5)
Chloride: 103 meq/L (ref 96–112)
Creatinine, Ser: 0.92 mg/dL (ref 0.40–1.50)
GFR: 80.43 mL/min (ref >60.00)
Glucose, Bld: 85 mg/dL (ref 70–99)
Potassium: 4.8 meq/L (ref 3.5–5.1)
Sodium: 139 meq/L (ref 135–145)
Total Bilirubin: 1 mg/dL (ref 0.2–1.2)
Total Protein: 6.7 g/dL (ref 6.0–8.3)

## 2023-07-06 LAB — CBC
HCT: 41.5 % (ref 39.0–52.0)
Hemoglobin: 13.5 g/dL (ref 13.0–17.0)
MCHC: 32.4 g/dL (ref 30.0–36.0)
MCV: 99.4 fl (ref 78.0–100.0)
Platelets: 180 10*3/uL (ref 150.0–400.0)
RBC: 4.17 Mil/uL — ABNORMAL LOW (ref 4.22–5.81)
RDW: 14 % (ref 11.5–15.5)
WBC: 6.3 10*3/uL (ref 4.0–10.5)

## 2023-07-06 LAB — IBC + FERRITIN
Ferritin: 145.5 ng/mL (ref 22.0–322.0)
Iron: 128 ug/dL (ref 42–165)
Saturation Ratios: 39.8 % (ref 20.0–50.0)
TIBC: 322 ug/dL (ref 250.0–450.0)
Transferrin: 230 mg/dL (ref 212.0–360.0)

## 2023-07-06 LAB — VITAMIN B12: Vitamin B-12: 720 pg/mL (ref 211–911)

## 2023-07-06 LAB — LIPID PANEL
Cholesterol: 129 mg/dL (ref 0–200)
HDL: 40 mg/dL (ref >39.00)
LDL Cholesterol: 69 mg/dL (ref 0–99)
NonHDL: 89.28
Total CHOL/HDL Ratio: 3
Triglycerides: 103 mg/dL (ref 0.0–149.0)
VLDL: 20.6 mg/dL (ref 0.0–40.0)

## 2023-08-10 DIAGNOSIS — G4733 Obstructive sleep apnea (adult) (pediatric): Secondary | ICD-10-CM | POA: Diagnosis not present

## 2023-08-14 DIAGNOSIS — H2511 Age-related nuclear cataract, right eye: Secondary | ICD-10-CM | POA: Diagnosis not present

## 2023-08-14 DIAGNOSIS — H25811 Combined forms of age-related cataract, right eye: Secondary | ICD-10-CM | POA: Diagnosis not present

## 2023-08-22 DIAGNOSIS — H43813 Vitreous degeneration, bilateral: Secondary | ICD-10-CM | POA: Diagnosis not present

## 2023-08-22 DIAGNOSIS — H25812 Combined forms of age-related cataract, left eye: Secondary | ICD-10-CM | POA: Diagnosis not present

## 2023-08-29 DIAGNOSIS — H2512 Age-related nuclear cataract, left eye: Secondary | ICD-10-CM | POA: Diagnosis not present

## 2023-08-29 DIAGNOSIS — H25812 Combined forms of age-related cataract, left eye: Secondary | ICD-10-CM | POA: Diagnosis not present

## 2023-09-09 DIAGNOSIS — G4733 Obstructive sleep apnea (adult) (pediatric): Secondary | ICD-10-CM | POA: Diagnosis not present

## 2023-10-10 DIAGNOSIS — G4733 Obstructive sleep apnea (adult) (pediatric): Secondary | ICD-10-CM | POA: Diagnosis not present

## 2023-11-30 DIAGNOSIS — Z86718 Personal history of other venous thrombosis and embolism: Secondary | ICD-10-CM | POA: Insufficient documentation

## 2023-11-30 DIAGNOSIS — H903 Sensorineural hearing loss, bilateral: Secondary | ICD-10-CM | POA: Insufficient documentation

## 2023-11-30 DIAGNOSIS — E559 Vitamin D deficiency, unspecified: Secondary | ICD-10-CM | POA: Insufficient documentation

## 2023-11-30 DIAGNOSIS — F321 Major depressive disorder, single episode, moderate: Secondary | ICD-10-CM | POA: Insufficient documentation

## 2023-11-30 DIAGNOSIS — F4320 Adjustment disorder, unspecified: Secondary | ICD-10-CM | POA: Insufficient documentation

## 2023-11-30 DIAGNOSIS — Z7729 Contact with and (suspected ) exposure to other hazardous substances: Secondary | ICD-10-CM | POA: Insufficient documentation

## 2023-11-30 DIAGNOSIS — C61 Malignant neoplasm of prostate: Secondary | ICD-10-CM | POA: Insufficient documentation

## 2023-12-26 DIAGNOSIS — G4733 Obstructive sleep apnea (adult) (pediatric): Secondary | ICD-10-CM | POA: Diagnosis not present

## 2024-01-09 DIAGNOSIS — I825Y3 Chronic embolism and thrombosis of unspecified deep veins of proximal lower extremity, bilateral: Secondary | ICD-10-CM | POA: Diagnosis not present

## 2024-01-09 DIAGNOSIS — D6851 Activated protein C resistance: Secondary | ICD-10-CM | POA: Diagnosis not present

## 2024-01-09 DIAGNOSIS — D6859 Other primary thrombophilia: Secondary | ICD-10-CM | POA: Diagnosis not present

## 2024-01-23 DIAGNOSIS — G4733 Obstructive sleep apnea (adult) (pediatric): Secondary | ICD-10-CM | POA: Diagnosis not present

## 2024-01-24 ENCOUNTER — Ambulatory Visit (INDEPENDENT_AMBULATORY_CARE_PROVIDER_SITE_OTHER): Payer: Medicare HMO

## 2024-01-24 VITALS — Ht 73.0 in | Wt 208.0 lb

## 2024-01-24 DIAGNOSIS — Z0000 Encounter for general adult medical examination without abnormal findings: Secondary | ICD-10-CM | POA: Diagnosis not present

## 2024-01-24 NOTE — Progress Notes (Signed)
 Subjective:   Terry Myers is a 78 y.o. who presents for a Medicare Wellness preventive visit.  Visit Complete: Virtual I connected with  [name] on 01/24/24 by a audio enabled telemedicine application and verified that I am speaking with the correct person using two identifiers.  Patient Location: Home  Provider Location: Home Office  I discussed the limitations of evaluation and management by telemedicine. The patient expressed understanding and agreed to proceed.  Vital Signs: Because this visit was a virtual/telehealth visit, some criteria may be missing or patient reported. Any vitals not documented were not able to be obtained and vitals that have been documented are patient reported.  VideoDeclined- This patient declined Librarian, academic. Therefore the visit was completed with audio only.  Persons Participating in Visit: Patient.  AWV Questionnaire: Yes: Patient Medicare AWV questionnaire was completed by the patient on 01/17/24; I have confirmed that all information answered by patient is correct and no changes since this date.  Cardiac Risk Factors include: advanced age (>57men, >93 women);male gender;hypertension     Objective:    Today's Vitals   01/24/24 1454  Weight: 208 lb (94.3 kg)  Height: 6\' 1"  (1.854 m)   Body mass index is 27.44 kg/m.     01/24/2024    3:00 PM 01/03/2023   11:02 AM 12/27/2021   11:48 AM 02/24/2014    6:58 PM  Advanced Directives  Does Patient Have a Medical Advance Directive? No No No Patient does not have advance directive;Patient would not like information  Would patient like information on creating a medical advance directive? No - Patient declined No - Patient declined Yes (MAU/Ambulatory/Procedural Areas - Information given)   Pre-existing out of facility DNR order (yellow form or pink MOST form)    No    Current Medications (verified) Outpatient Encounter Medications as of 01/24/2024   Medication Sig   alfuzosin (UROXATRAL) 10 MG 24 hr tablet Take 1 tablet (10 mg total) by mouth daily with breakfast.   Ascorbic Acid (VITAMIN C) 1000 MG tablet Take 1,000 mg by mouth daily.   cholecalciferol (VITAMIN D3) 25 MCG (1000 UNIT) tablet Take 1,000 Units by mouth daily.   ferrous sulfate 325 (65 FE) MG tablet    fluticasone (FLONASE) 50 MCG/ACT nasal spray Place 2 sprays into both nostrils daily.   losartan (COZAAR) 100 MG tablet Take 1 tablet (100 mg total) by mouth daily.   rivaroxaban (XARELTO) 10 MG TABS tablet Take 10 mg by mouth daily.   rosuvastatin (CRESTOR) 10 MG tablet Take 1 tablet (10 mg total) by mouth daily.   vitamin B-12 (CYANOCOBALAMIN) 1000 MCG tablet Take 1,000 mcg by mouth daily.   No facility-administered encounter medications on file as of 01/24/2024.    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   Prostate cancer (HCC) dx'd 06/2011   Sleep apnea    "wore mask a long time ago"   Past Surgical History:  Procedure Laterality Date   BACK SURGERY     POSTERIOR CERVICAL FUSION/FORAMINOTOMY  2005   C3-C7   PROSTATE BIOPSY  06/2011; 01/2014   TONSILLECTOMY  1950's   Family History  Problem Relation Age of Onset   Cancer Mother  Heart attack Brother    Social History   Socioeconomic History   Marital status: Married    Spouse name: Not on file   Number of children: Not on file   Years of education: Not on file   Highest education level: Bachelor's degree (e.g., BA, AB, BS)  Occupational History   Not on file  Tobacco Use   Smoking status: Former    Current packs/day: 2.00    Average packs/day: 2.0 packs/day for 10.0 years (20.0 ttl pk-yrs)    Types: Cigarettes   Smokeless tobacco: Never   Tobacco comments:     02/24/2014 "quit smoking in the 1970's"  Substance and Sexual Activity   Alcohol use: Yes    Alcohol/week: 14.0 standard drinks of alcohol    Types: 14 Glasses of wine per week    Comment: 02/24/2014 "at least 2 glasses of wine every day"   Drug use: No   Sexual activity: Yes  Other Topics Concern   Not on file  Social History Narrative   Not on file   Social Drivers of Health   Financial Resource Strain: Low Risk  (01/24/2024)   Overall Financial Resource Strain (CARDIA)    Difficulty of Paying Living Expenses: Not very hard  Food Insecurity: No Food Insecurity (01/24/2024)   Hunger Vital Sign    Worried About Running Out of Food in the Last Year: Never true    Ran Out of Food in the Last Year: Never true  Transportation Needs: No Transportation Needs (01/24/2024)   PRAPARE - Administrator, Civil Service (Medical): No    Lack of Transportation (Non-Medical): No  Physical Activity: Unknown (01/24/2024)   Exercise Vital Sign    Days of Exercise per Week: 0 days    Minutes of Exercise per Session: Not on file  Stress: No Stress Concern Present (01/24/2024)   [employer] of Occupational Health - Occupational Stress Questionnaire    Feeling of Stress : Only a little  Social Connections: Unknown (01/24/2024)   Social Connection and Isolation Panel [NHANES]    Frequency of Communication with Friends and Family: Once a week    Frequency of Social Gatherings with Friends and Family: Patient declined    Attends Religious Services: Never    Database administrator or Organizations: Yes    Attends Banker Meetings: Never    Marital Status: Married    Tobacco Counseling Counseling given: Not Answered Tobacco comments: 02/24/2014 "quit smoking in the 1970's"    Clinical Intake:  Pre-visit preparation completed: Yes  Pain : No/denies pain     BMI - recorded: 27.44 Nutritional Status: BMI 25 -29 Overweight Nutritional Risks: None Diabetes: No  How  often do you need to have someone help you when you read instructions, pamphlets, or other written materials from your doctor or pharmacy?: 1 - Never  Interpreter Needed?: No  Information entered by :: Theresa Mulligan LPN   Activities of Daily Living     01/24/2024    2:59 PM 01/17/2024    3:28 PM  In your present state of health, do you have any difficulty performing the following activities:  Hearing? 0 0  Vision? 0 0  Difficulty concentrating or making decisions? 0 0  Walking or climbing stairs? 0 0  Dressing or bathing? 0 0  Doing errands, shopping? 0 0  Preparing Food and eating ? N N  Using the Toilet? N N  In the past six months, have you accidently  leaked urine? N N  Do you have problems with loss of bowel control? N N  Managing your Medications? N N  Managing your Finances? N N  Housekeeping or managing your Housekeeping? N N    Patient Care Team: Sharlene Dory, DO as PCP - General (Family Medicine) William Hamburger Teena Irani, MD as Attending Physician (Family Medicine)  Indicate any recent Medical Services you may have received from other than [facility] providers in the past year (date may be approximate).     Assessment:   This is a routine wellness examination for [place].  Hearing/Vision screen Hearing Screening - Comments:: Denies hearing difficulties   Vision Screening - Comments:: Wears rx glasses - up to date with routine eye exams with  Dr Harl Bowie [facility]   Goals Addressed               This Visit's Progress     Remain Active (pt-stated)         Depression Screen     01/24/2024    3:03 PM 07/05/2023   12:57 PM 01/03/2023   11:03 AM 07/24/2022    1:33 PM 12/27/2021   11:55 AM 07/13/2021    2:41 PM 04/12/2021    3:54 PM  PHQ 2/9 Scores  PHQ - 2 Score 0 0 2 2 0 0 0  PHQ- 9 Score  0  5       Fall Risk     01/24/2024    2:59 PM 01/17/2024    3:28 PM 07/05/2023   12:57 PM 01/03/2023   11:02 AM 07/24/2022    1:32 PM  Fall Risk   Falls in  the past year? 0 0 0 0 0  Number falls in past yr: 0 0 0 0 0  Injury with Fall? 0 0 0 0 0  Risk for fall due to : No Fall Risks  No Fall Risks No Fall Risks No Fall Risks  Follow up Falls prevention discussed;Falls evaluation completed  Falls evaluation completed Falls evaluation completed Falls evaluation completed    MEDICARE RISK AT HOME:  Medicare Risk at Home Any stairs in or around the home?: Yes If so, are there any without handrails?: No Home free of loose throw rugs in walkways, pet beds, electrical cords, etc?: No Adequate lighting in your home to reduce risk of falls?: Yes Life alert?: No Use of a cane, walker or w/c?: No Grab bars in the bathroom?: Yes Shower chair or bench in shower?: No Elevated toilet seat or a handicapped toilet?: No  TIMED UP AND GO:  Was the test performed?  No  Cognitive Function: 6CIT completed        01/24/2024    3:00 PM 01/03/2023   11:07 AM  6CIT Screen  What Year? 0 points 0 points  What month? 0 points 0 points  What time? 0 points 0 points  Count back from 20 0 points 0 points  Months in reverse 0 points 0 points  Repeat phrase 0 points 2 points  Total Score 0 points 2 points    Immunizations Immunization History  Administered Date(s) Administered   Fluad Quad(high Dose 65+) 08/11/2022   Influenza Split 09/13/2016, 08/18/2020   Influenza, High Dose Seasonal PF 10/14/2017, 07/10/2019, 09/08/2021, 08/11/2022   Influenza-Unspecified 08/24/2023   PFIZER Comirnaty(Gray Top)Covid-19 Tri-Sucrose Vaccine 12/02/2019, 12/23/2019, 09/02/2020, 05/30/2021   PFIZER(Purple Top)SARS-COV-2 Vaccination 12/02/2019, 12/23/2019, 09/02/2020, 08/11/2022   Pfizer Covid-19 Vaccine Bivalent Booster 17yrs & up 09/08/2021   Pfizer(Comirnaty)Fall  Seasonal Vaccine 12 years and older 08/11/2022, 08/24/2023   Pneumococcal Conjugate-13 01/11/2016   Pneumococcal Polysaccharide-23 04/09/2018   Tdap 12/24/2012, 10/31/2023   Zoster Recombinant(Shingrix)  04/20/2022, 09/20/2022    Screening Tests Health Maintenance  Topic Date Due   COVID-19 Vaccine (11 - Pfizer risk 2024-25 season) 02/22/2024   Medicare Annual Wellness (AWV)  01/23/2025   DTaP/Tdap/Td (3 - Td or Tdap) 10/30/2033   Pneumonia Vaccine 23+ Years old  Completed   INFLUENZA VACCINE  Completed   Hepatitis C Screening  Completed   Zoster Vaccines- Shingrix  Completed   HPV VACCINES  Aged Out   Colonoscopy  Discontinued    Health Maintenance  There are no preventive care reminders to display for this patient.  Health Maintenance Items Addressed:    Additional Screening:  Vision Screening: Recommended annual ophthalmology exams for early detection of glaucoma and other disorders of the eye.  Dental Screening: Recommended annual dental exams for proper oral hygiene  Community Resource Referral / Chronic Care Management: CRR required this visit?  No   CCM required this visit?  No     Plan:     I have personally reviewed and noted the following in the patient's chart:   Medical and social history Use of alcohol, tobacco or illicit drugs  Current medications and supplements including opioid prescriptions. Patient is not currently taking opioid prescriptions. Functional ability and status Nutritional status Physical activity Advanced directives List of other physicians Hospitalizations, surgeries, and ER visits in previous 12 months Vitals Screenings to include cognitive, depression, and falls Referrals and appointments  In addition, I have reviewed and discussed with patient certain preventive protocols, quality metrics, and best practice recommendations. A written personalized care plan for preventive services as well as general preventive health recommendations were provided to patient.     Tillie Rung, LPN   [id number]   After Visit Summary: (MyChart) Due to this being a telephonic visit, the after visit summary with patients personalized plan  was offered to patient via MyChart   Notes: Nothing significant to report at this time.

## 2024-01-24 NOTE — Patient Instructions (Addendum)
 Mr. Terry Myers , Thank you for taking time to come for your Medicare Wellness Visit. I appreciate your ongoing commitment to your health goals. Please review the following plan we discussed and let me know if I can assist you in the future.   Referrals/Orders/Follow-Ups/Clinician Recommendations:   This is a list of the screening recommended for you and due dates:  Health Maintenance  Topic Date Due   COVID-19 Vaccine (11 - Pfizer risk 2024-25 season) 02/22/2024   Medicare Annual Wellness Visit  01/23/2025   DTaP/Tdap/Td vaccine (3 - Td or Tdap) 10/30/2033   Pneumonia Vaccine  Completed   Flu Shot  Completed   Hepatitis C Screening  Completed   Zoster (Shingles) Vaccine  Completed   HPV Vaccine  Aged Out   Colon Cancer Screening  Discontinued    Advanced directives: (Declined) Advance directive discussed with you today. Even though you declined this today, please call our office should you change your mind, and we can give you the proper paperwork for you to fill out.  Next Medicare Annual Wellness Visit scheduled for next year: Yes

## 2024-01-28 DIAGNOSIS — Z9989 Dependence on other enabling machines and devices: Secondary | ICD-10-CM | POA: Diagnosis not present

## 2024-01-28 DIAGNOSIS — G4733 Obstructive sleep apnea (adult) (pediatric): Secondary | ICD-10-CM | POA: Diagnosis not present

## 2024-02-14 ENCOUNTER — Encounter: Payer: Self-pay | Admitting: Family Medicine

## 2024-02-14 MED ORDER — FLUTICASONE PROPIONATE 50 MCG/ACT NA SUSP: 2.0000 | Freq: Every day | NASAL | 1 refills | Status: AC

## 2024-02-25 DIAGNOSIS — K080 Exfoliation of teeth due to systemic causes: Secondary | ICD-10-CM | POA: Diagnosis not present

## 2024-03-24 DIAGNOSIS — G4733 Obstructive sleep apnea (adult) (pediatric): Secondary | ICD-10-CM | POA: Diagnosis not present

## 2024-03-25 DIAGNOSIS — G4733 Obstructive sleep apnea (adult) (pediatric): Secondary | ICD-10-CM | POA: Diagnosis not present

## 2024-04-21 DIAGNOSIS — H5203 Hypermetropia, bilateral: Secondary | ICD-10-CM | POA: Diagnosis not present

## 2024-04-25 DIAGNOSIS — G4733 Obstructive sleep apnea (adult) (pediatric): Secondary | ICD-10-CM | POA: Diagnosis not present

## 2024-05-25 DIAGNOSIS — G4733 Obstructive sleep apnea (adult) (pediatric): Secondary | ICD-10-CM | POA: Diagnosis not present

## 2024-07-09 ENCOUNTER — Ambulatory Visit (INDEPENDENT_AMBULATORY_CARE_PROVIDER_SITE_OTHER): Admitting: Family Medicine

## 2024-07-09 ENCOUNTER — Encounter: Payer: Self-pay | Admitting: Family Medicine

## 2024-07-09 VITALS — BP 126/78 | HR 56 | Temp 97.7°F | Resp 16 | Ht 73.0 in | Wt 211.6 lb

## 2024-07-09 DIAGNOSIS — Z0000 Encounter for general adult medical examination without abnormal findings: Secondary | ICD-10-CM

## 2024-07-09 MED ORDER — LEVOCETIRIZINE DIHYDROCHLORIDE 5 MG PO TABS: 5.0000 mg | ORAL_TABLET | Freq: Every evening | ORAL | 2 refills | Status: AC

## 2024-07-09 NOTE — Progress Notes (Signed)
 Chief Complaint  Patient presents with   Annual Exam    CPE    Well Male Terry Myers is here for a complete physical.   His last physical was >1 year ago.  Current diet: in general, a healthy diet.   Current exercise: yoga, tai chi, exercise classes Weight trend: stable Fatigue out of ordinary? No. Seat belt? Yes.   Advanced directive? 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 90 tablet 1   Ascorbic Acid (VITAMIN C) 1000 MG tablet Take 1,000 mg by mouth daily.     cholecalciferol (VITAMIN D3) 25 MCG (1000 UNIT) tablet Take 1,000 Units by mouth daily.     ferrous sulfate 325 (65 FE) MG tablet      fluticasone  (FLONASE ) 50 MCG/ACT nasal spray Place 2 sprays into both nostrils daily. 48 g 1   losartan  (COZAAR ) 100 MG tablet Take 1 tablet (100 mg total) by mouth daily. 90 tablet 1   rivaroxaban (XARELTO) 10 MG TABS tablet Take 10 mg by mouth daily.     rosuvastatin  (CRESTOR ) 10 MG tablet Take 1 tablet (10 mg total) by mouth daily. 90 tablet 3   vitamin B-12 (CYANOCOBALAMIN ) 1000 MCG tablet Take 1,000 mcg by mouth daily.      Allergies Allergies  Allergen Reactions   Hydrochlorothiazide Rash   Tamsulosin Other (See Comments) and Palpitations    Lower  back and side pain; agitation Lower back and side pain; agitation     Family History Family History  Problem Relation Age of Onset   Cancer Mother    Heart attack Brother     Review of Systems: Constitutional:  no fevers Eye:  no recent significant change in vision Ears:  No changes in hearing Nose/Mouth/Throat:  no complaints of nasal congestion, no sore throat Cardiovascular: no chest pain Respiratory:  No shortness of breath Gastrointestinal:  No change in bowel habits GU:  No frequency Integumentary:  no abnormal skin lesions reported Neurologic:  no headaches Endocrine:  denies unexplained weight changes  Exam BP 126/78 (BP Location: Left Arm, Patient Position: Sitting)   Pulse (!) 56   Temp 97.7 F (36.5 C) (Oral)   Resp 16   Ht 6' 1 (1.854 m)   Wt 211 lb 9.6 oz (96 kg)   SpO2 97%   BMI 27.92 kg/m  General:  well developed, well nourished, in no apparent distress Skin:  no significant moles, warts, or growths Head:  no masses, lesions, or tenderness Eyes:  pupils equal and round, sclera anicteric without injection Ears:  canals without lesions, TMs shiny without retraction, no obvious effusion, no erythema Nose:  nares patent, mucosa normal Throat/Pharynx:  lips and gingiva without lesion; tongue and uvula midline; non-inflamed pharynx; no exudates or postnasal drainage Lungs:  clear to auscultation, breath sounds equal bilaterally, no respiratory distress Cardio:  regular rate and rhythm, no LE edema or bruits Rectal: Deferred GI: BS+, S, NT, ND, no masses or organomegaly Musculoskeletal:  symmetrical muscle groups noted without atrophy or deformity Neuro:  gait normal; deep tendon reflexes normal and symmetric Psych: well oriented with normal range of affect and appropriate judgment/insight  Assessment and Plan  Well adult exam   Well 78 y.o. male. Counseled on diet and exercise. Had labs done thru [place] and Alzheimer's study.  Advanced directive form  provided/requested today.  Other orders as above. Follow up in 1 yr.  The patient voiced understanding and agreement to the plan.  Mabel [place], DO 07/09/24 10:58 AM

## 2024-07-09 NOTE — Patient Instructions (Addendum)
 Keep the diet clean and stay active.  Please get me a copy of your advanced directive form at your convenience.   I recommend getting the flu shot in mid October. This suggestion would change if the CDC comes out with a different recommendation.   Claritin (loratadine), Allegra (fexofenadine), Zyrtec (cetirizine) which is also equivalent to Xyzal  (levocetirizine); these are listed in order from weakest to strongest. Generic, and therefore cheaper, options are in the parentheses.   Flonase  (fluticasone ); nasal spray that is over the counter. 2 sprays each nostril, once daily. Aim towards the same side eye when you spray.  There are available OTC, and the generic versions, which may be cheaper, are in parentheses. Show this to a pharmacist if you have trouble finding any of these items.  Foods that may reduce pain: 1) Ginger 2) Blueberries 3) Salmon 4) Pumpkin seeds 5) Dark chocolate 6) Turmeric 7) Tart cherries 8) Virgin olive oil 9) Chili peppers 10) Mint 11) Krill oil  Let us  know if you need anything.

## 2025-01-29 ENCOUNTER — Ambulatory Visit
